# Patient Record
Sex: Male | Born: 1955 | Race: White | Hispanic: No | Marital: Married | Smoking: Current every day smoker
Health system: Southern US, Community
[De-identification: ages and names within clinical notes are randomized; demographics above are authoritative.]

## PROBLEM LIST (undated history)

## (undated) DIAGNOSIS — Z85820 Personal history of malignant melanoma of skin: Secondary | ICD-10-CM

## (undated) DIAGNOSIS — H409 Unspecified glaucoma: Secondary | ICD-10-CM

## (undated) DIAGNOSIS — I10 Essential (primary) hypertension: Secondary | ICD-10-CM

## (undated) DIAGNOSIS — Z794 Long term (current) use of insulin: Secondary | ICD-10-CM

## (undated) DIAGNOSIS — E119 Type 2 diabetes mellitus without complications: Secondary | ICD-10-CM

## (undated) DIAGNOSIS — Z8673 Personal history of transient ischemic attack (TIA), and cerebral infarction without residual deficits: Secondary | ICD-10-CM

## (undated) DIAGNOSIS — Z87891 Personal history of nicotine dependence: Secondary | ICD-10-CM

## (undated) DIAGNOSIS — E11319 Type 2 diabetes mellitus with unspecified diabetic retinopathy without macular edema: Secondary | ICD-10-CM

## (undated) HISTORY — DX: Type 2 diabetes mellitus with unspecified diabetic retinopathy without macular edema: E11.319

## (undated) HISTORY — DX: Type 2 diabetes mellitus without complications: E11.9

## (undated) HISTORY — DX: Essential (primary) hypertension: I10

## (undated) HISTORY — DX: Personal history of nicotine dependence: Z87.891

## (undated) HISTORY — DX: Personal history of transient ischemic attack (TIA), and cerebral infarction without residual deficits: Z86.73

## (undated) HISTORY — DX: Unspecified glaucoma: H40.9

## (undated) HISTORY — DX: Type 2 diabetes mellitus without complications: Z79.4

## (undated) HISTORY — DX: Personal history of malignant melanoma of skin: Z85.820

## (undated) HISTORY — PX: DEEP NECK LYMPH NODE BIOPSY / EXCISION: SUR126

---

## 2003-08-01 ENCOUNTER — Inpatient Hospital Stay (HOSPITAL_COMMUNITY): Admission: EM | Admit: 2003-08-01 | Discharge: 2003-08-06 | Payer: Self-pay

## 2006-01-10 IMAGING — CT CT ABDOMEN W/ CM
1 of 2 series · 15 of 32 positions shown, 19 images · IV contrast (MINIMAL G+ & 120 ML OMNI 300)
Comparison: none

CLINICAL DATA: Abdominal pain and rectal bleeding.  Currently taking Interferon for malignant melanoma.
CT ABDOMEN WITH CONTRAST
Helical transaxial images of the abdomen and pelvis were obtained with oral contrast and 100 cc of Omnipaque 300 intravenous contrast.  This demonstrates diffuse low density colon wall thickening with sparing of the rectum.  There is associated pericolonic soft tissue stranding adjacent to the right colon and hepatic flexure.  Diffuse fatty infiltration of the liver is noted with an area of sparing adjacent to the gallbladder.  Prominent interstitial markings at the lung bases with mild bilateral dependent atelectasis.  Normal appearing spleen, pancreas, gallbladder, kidneys, and adrenal glands.  No gastrointestinal abnormalities or enlarged lymph nodes.  No evidence of bony metastatic disease.  
IMPRESSION
1.  Pancolitis.  This is most likely infectious in origin.  Pseudomembranous colitis can have this appearance.  Inflammatory and ischemic colitis are less likely possibilities.
2.  Diffuse fatty infiltration of the liver with an area of sparing adjacent to the gallbladder.  
3.  Interstitial lung disease.  
CT PELVIS WITH CONTRAST
Changes of colitis are again noted with rectal sparing.  Normal appearing urinary bladder.  No masses or enlarged lymph nodes.  No evidence of bony metastatic disease.
Colitis with rectal sparing.  Please see the above discussion.

[Series 2: routine abdomen · axial · 0.70mm/px · z∈[-460,-20]mm · 15 of 95 slices shown, 19 images]
[im 4/95  soft-tissue]
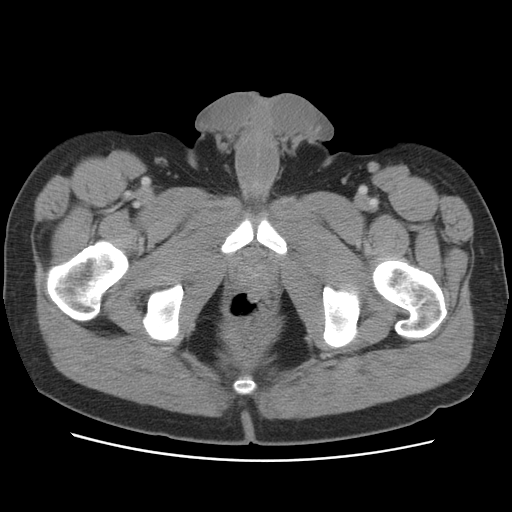
[im 4/95  bone]
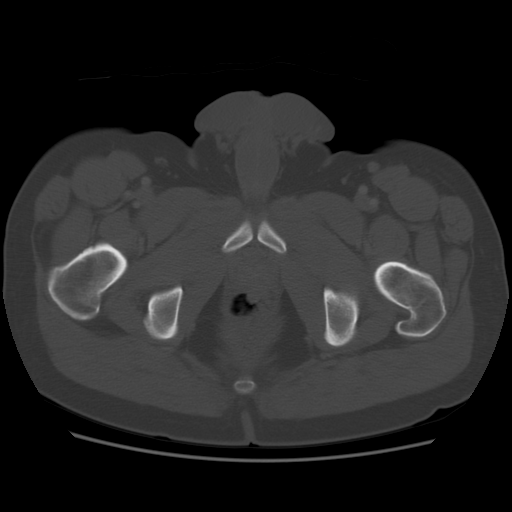
[im 12/95  soft-tissue]
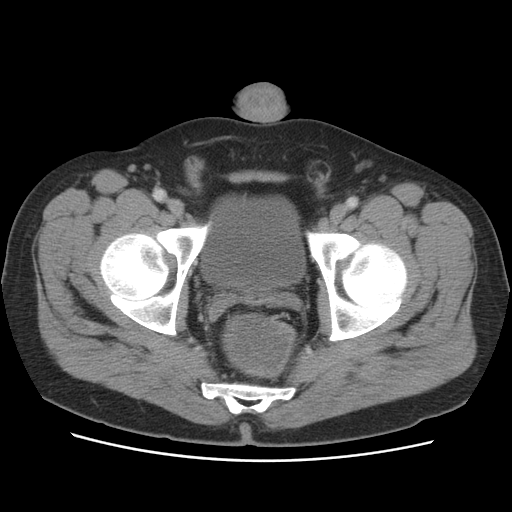
[im 20/95  soft-tissue]
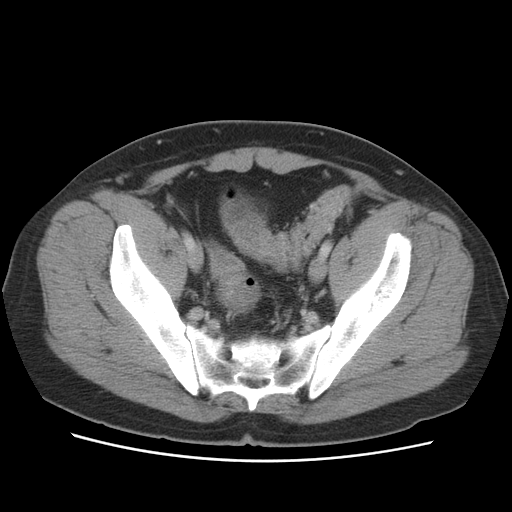
[im 28/95  soft-tissue]
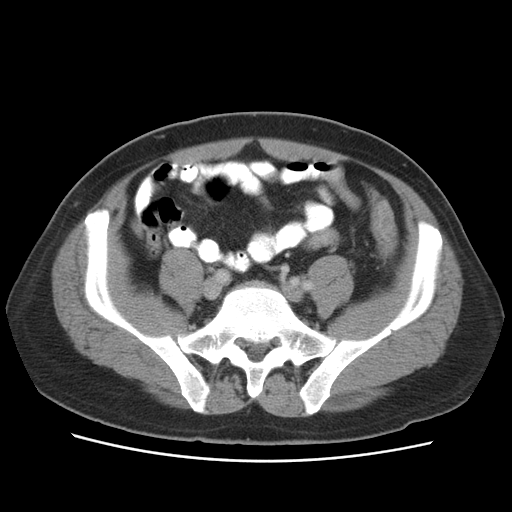
[im 32/95  soft-tissue]
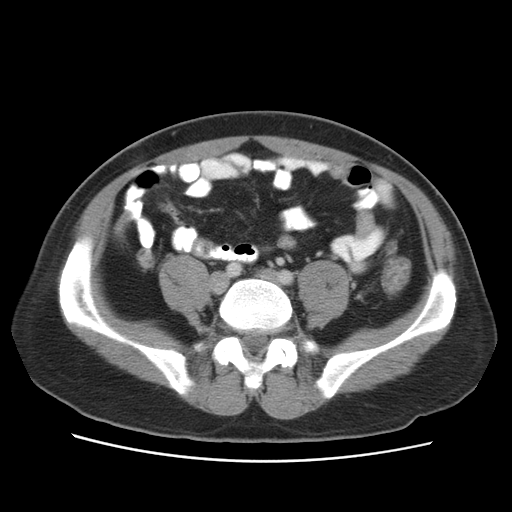
[im 40/95  soft-tissue]
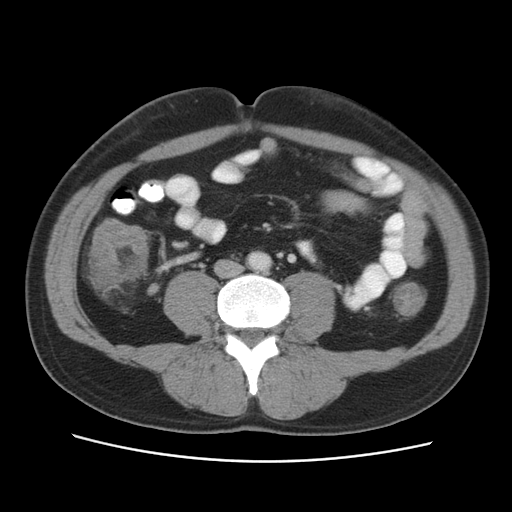
[im 48/95  soft-tissue]
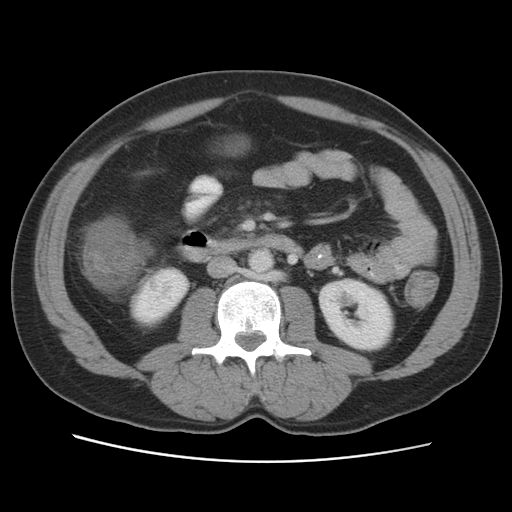
[im 55/95  soft-tissue]
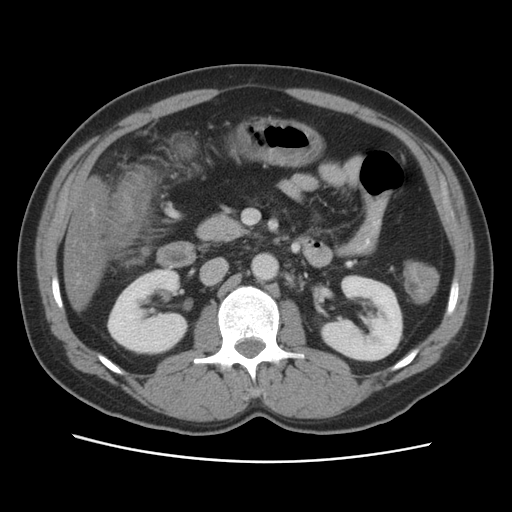
[im 63/95  soft-tissue]
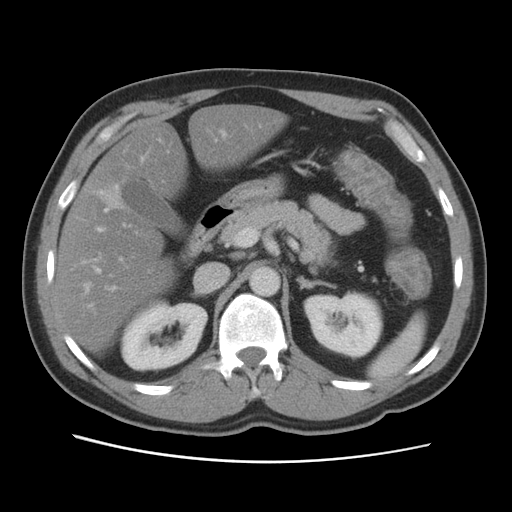
[im 63/95  bone]
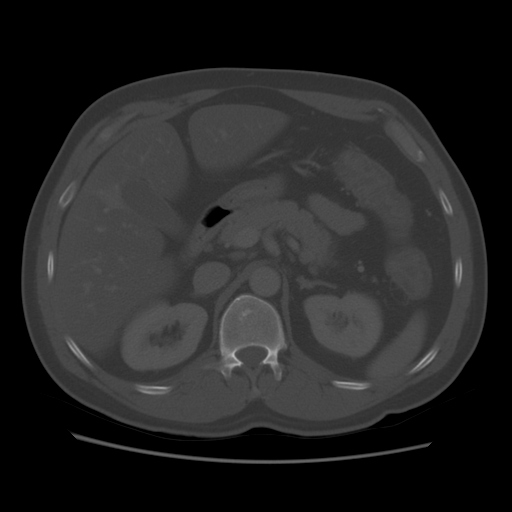
[im 67/95  soft-tissue]
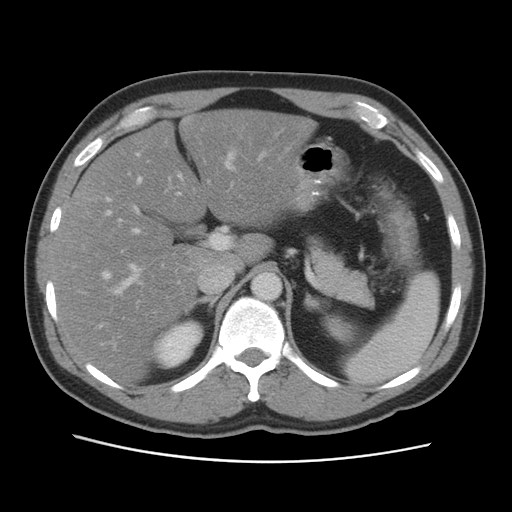
[im 75/95  soft-tissue]
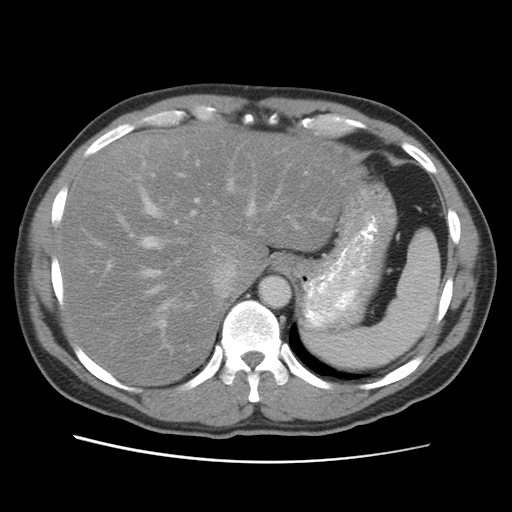
[im 79/95  lung]
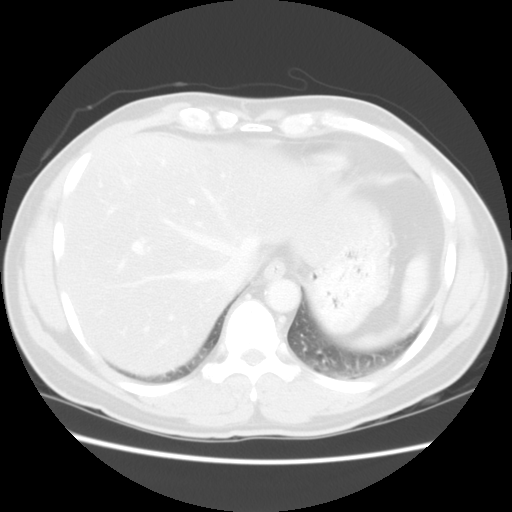
[im 83/95  soft-tissue]
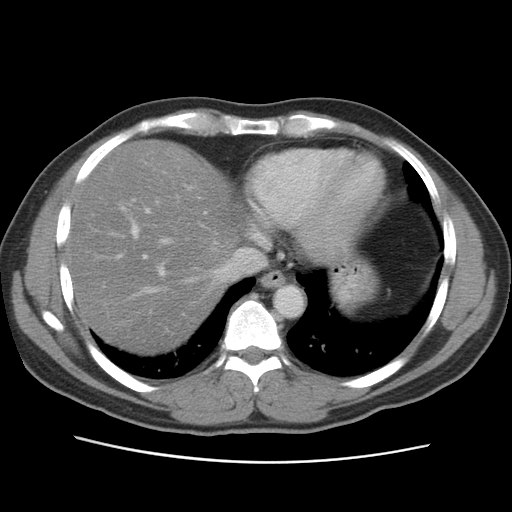
[im 83/95  lung]
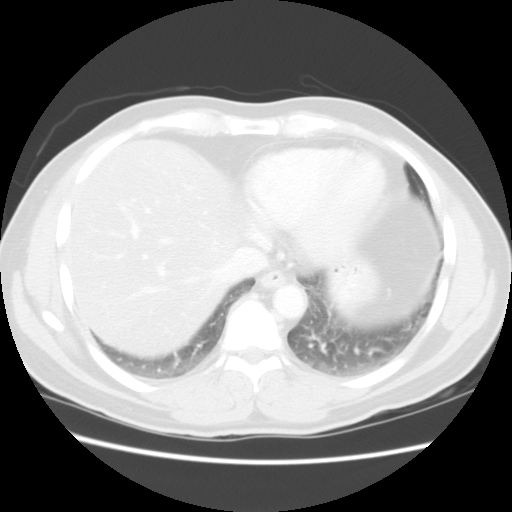
[im 87/95  lung]
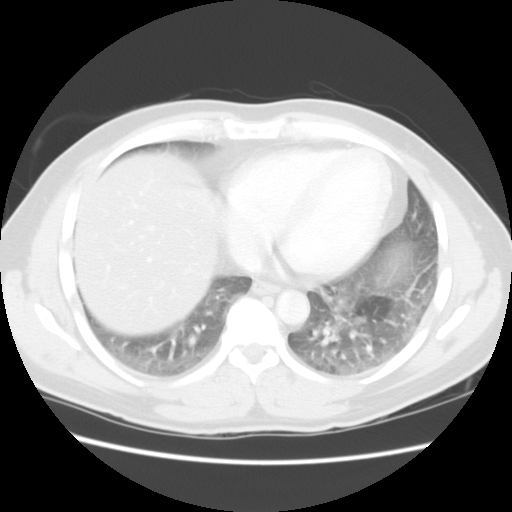
[im 91/95  soft-tissue]
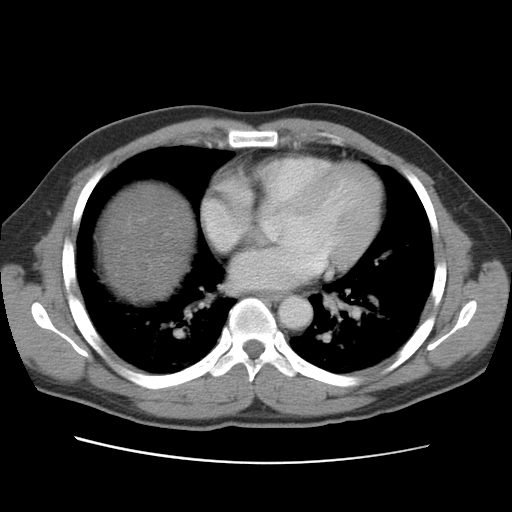
[im 91/95  lung]
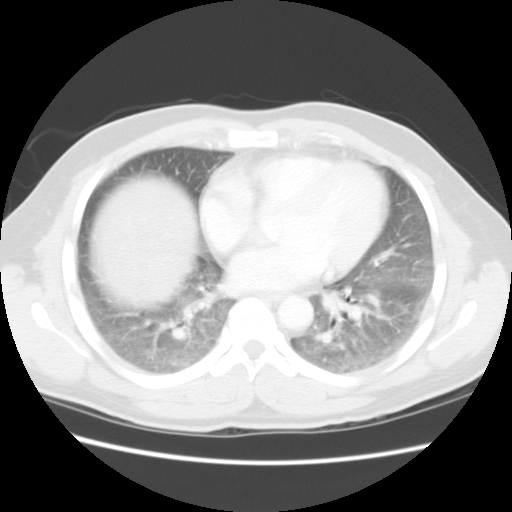

[15 of 32 positions shown; findings below may reference images not displayed]

## 2016-06-27 DIAGNOSIS — E119 Type 2 diabetes mellitus without complications: Secondary | ICD-10-CM

## 2016-06-27 DIAGNOSIS — I6350 Cerebral infarction due to unspecified occlusion or stenosis of unspecified cerebral artery: Secondary | ICD-10-CM

## 2019-05-15 DIAGNOSIS — H401134 Primary open-angle glaucoma, bilateral, indeterminate stage: Secondary | ICD-10-CM | POA: Diagnosis not present

## 2019-05-27 DIAGNOSIS — E782 Mixed hyperlipidemia: Secondary | ICD-10-CM | POA: Diagnosis not present

## 2019-05-27 DIAGNOSIS — Z794 Long term (current) use of insulin: Secondary | ICD-10-CM | POA: Diagnosis not present

## 2019-05-27 DIAGNOSIS — Z713 Dietary counseling and surveillance: Secondary | ICD-10-CM | POA: Diagnosis not present

## 2019-05-27 DIAGNOSIS — E119 Type 2 diabetes mellitus without complications: Secondary | ICD-10-CM | POA: Diagnosis not present

## 2019-05-27 DIAGNOSIS — I10 Essential (primary) hypertension: Secondary | ICD-10-CM | POA: Diagnosis not present

## 2019-06-27 ENCOUNTER — Encounter: Payer: Self-pay | Admitting: Pharmacist

## 2019-06-27 DIAGNOSIS — E119 Type 2 diabetes mellitus without complications: Secondary | ICD-10-CM | POA: Diagnosis not present

## 2019-06-27 DIAGNOSIS — Z713 Dietary counseling and surveillance: Secondary | ICD-10-CM | POA: Diagnosis not present

## 2019-06-27 DIAGNOSIS — Z794 Long term (current) use of insulin: Secondary | ICD-10-CM | POA: Diagnosis not present

## 2019-06-27 DIAGNOSIS — Z6824 Body mass index (BMI) 24.0-24.9, adult: Secondary | ICD-10-CM | POA: Diagnosis not present

## 2019-08-20 DIAGNOSIS — E782 Mixed hyperlipidemia: Secondary | ICD-10-CM | POA: Diagnosis not present

## 2019-08-20 DIAGNOSIS — Z6824 Body mass index (BMI) 24.0-24.9, adult: Secondary | ICD-10-CM | POA: Diagnosis not present

## 2019-08-20 DIAGNOSIS — I10 Essential (primary) hypertension: Secondary | ICD-10-CM | POA: Diagnosis not present

## 2019-08-20 DIAGNOSIS — E119 Type 2 diabetes mellitus without complications: Secondary | ICD-10-CM | POA: Diagnosis not present

## 2019-08-20 DIAGNOSIS — Z794 Long term (current) use of insulin: Secondary | ICD-10-CM | POA: Diagnosis not present

## 2019-11-20 DIAGNOSIS — E782 Mixed hyperlipidemia: Secondary | ICD-10-CM | POA: Diagnosis not present

## 2019-11-20 DIAGNOSIS — Z794 Long term (current) use of insulin: Secondary | ICD-10-CM | POA: Diagnosis not present

## 2019-11-20 DIAGNOSIS — Z23 Encounter for immunization: Secondary | ICD-10-CM | POA: Diagnosis not present

## 2019-11-20 DIAGNOSIS — I10 Essential (primary) hypertension: Secondary | ICD-10-CM | POA: Diagnosis not present

## 2019-11-20 DIAGNOSIS — E119 Type 2 diabetes mellitus without complications: Secondary | ICD-10-CM | POA: Diagnosis not present

## 2019-11-22 DIAGNOSIS — H401134 Primary open-angle glaucoma, bilateral, indeterminate stage: Secondary | ICD-10-CM | POA: Diagnosis not present

## 2020-01-15 DIAGNOSIS — Z9181 History of falling: Secondary | ICD-10-CM | POA: Diagnosis not present

## 2020-01-15 DIAGNOSIS — E785 Hyperlipidemia, unspecified: Secondary | ICD-10-CM | POA: Diagnosis not present

## 2020-01-15 DIAGNOSIS — Z0000 Encounter for general adult medical examination without abnormal findings: Secondary | ICD-10-CM | POA: Diagnosis not present

## 2020-01-31 DIAGNOSIS — H401132 Primary open-angle glaucoma, bilateral, moderate stage: Secondary | ICD-10-CM | POA: Diagnosis not present

## 2020-02-04 DIAGNOSIS — H401131 Primary open-angle glaucoma, bilateral, mild stage: Secondary | ICD-10-CM | POA: Diagnosis not present

## 2020-02-04 DIAGNOSIS — E119 Type 2 diabetes mellitus without complications: Secondary | ICD-10-CM | POA: Diagnosis not present

## 2020-02-04 DIAGNOSIS — Z794 Long term (current) use of insulin: Secondary | ICD-10-CM | POA: Diagnosis not present

## 2020-02-04 DIAGNOSIS — Z01818 Encounter for other preprocedural examination: Secondary | ICD-10-CM | POA: Diagnosis not present

## 2020-02-17 DIAGNOSIS — H25811 Combined forms of age-related cataract, right eye: Secondary | ICD-10-CM | POA: Diagnosis not present

## 2020-02-17 DIAGNOSIS — H2511 Age-related nuclear cataract, right eye: Secondary | ICD-10-CM | POA: Diagnosis not present

## 2020-02-17 DIAGNOSIS — H401111 Primary open-angle glaucoma, right eye, mild stage: Secondary | ICD-10-CM | POA: Diagnosis not present

## 2020-02-19 DIAGNOSIS — I10 Essential (primary) hypertension: Secondary | ICD-10-CM | POA: Diagnosis not present

## 2020-02-19 DIAGNOSIS — E782 Mixed hyperlipidemia: Secondary | ICD-10-CM | POA: Diagnosis not present

## 2020-02-19 DIAGNOSIS — E119 Type 2 diabetes mellitus without complications: Secondary | ICD-10-CM | POA: Diagnosis not present

## 2020-02-19 DIAGNOSIS — Z794 Long term (current) use of insulin: Secondary | ICD-10-CM | POA: Diagnosis not present

## 2020-02-19 DIAGNOSIS — Z8673 Personal history of transient ischemic attack (TIA), and cerebral infarction without residual deficits: Secondary | ICD-10-CM | POA: Diagnosis not present

## 2020-03-26 DIAGNOSIS — H401122 Primary open-angle glaucoma, left eye, moderate stage: Secondary | ICD-10-CM | POA: Diagnosis not present

## 2020-03-26 DIAGNOSIS — H2512 Age-related nuclear cataract, left eye: Secondary | ICD-10-CM | POA: Diagnosis not present

## 2020-03-26 DIAGNOSIS — H25812 Combined forms of age-related cataract, left eye: Secondary | ICD-10-CM | POA: Diagnosis not present

## 2020-05-20 DIAGNOSIS — I10 Essential (primary) hypertension: Secondary | ICD-10-CM | POA: Diagnosis not present

## 2020-05-20 DIAGNOSIS — Z794 Long term (current) use of insulin: Secondary | ICD-10-CM | POA: Diagnosis not present

## 2020-05-20 DIAGNOSIS — Z8673 Personal history of transient ischemic attack (TIA), and cerebral infarction without residual deficits: Secondary | ICD-10-CM | POA: Diagnosis not present

## 2020-05-20 DIAGNOSIS — E119 Type 2 diabetes mellitus without complications: Secondary | ICD-10-CM | POA: Diagnosis not present

## 2020-05-20 DIAGNOSIS — E782 Mixed hyperlipidemia: Secondary | ICD-10-CM | POA: Diagnosis not present

## 2020-09-11 DIAGNOSIS — E119 Type 2 diabetes mellitus without complications: Secondary | ICD-10-CM | POA: Diagnosis not present

## 2020-09-11 DIAGNOSIS — Z20822 Contact with and (suspected) exposure to covid-19: Secondary | ICD-10-CM | POA: Diagnosis not present

## 2020-09-11 DIAGNOSIS — E785 Hyperlipidemia, unspecified: Secondary | ICD-10-CM | POA: Diagnosis not present

## 2020-09-11 DIAGNOSIS — I10 Essential (primary) hypertension: Secondary | ICD-10-CM | POA: Diagnosis not present

## 2020-09-11 DIAGNOSIS — Z794 Long term (current) use of insulin: Secondary | ICD-10-CM | POA: Diagnosis not present

## 2020-09-11 DIAGNOSIS — Z8673 Personal history of transient ischemic attack (TIA), and cerebral infarction without residual deficits: Secondary | ICD-10-CM | POA: Diagnosis not present

## 2020-09-11 DIAGNOSIS — E1169 Type 2 diabetes mellitus with other specified complication: Secondary | ICD-10-CM | POA: Diagnosis not present

## 2020-09-11 DIAGNOSIS — Z79899 Other long term (current) drug therapy: Secondary | ICD-10-CM | POA: Diagnosis not present

## 2020-12-16 DIAGNOSIS — E785 Hyperlipidemia, unspecified: Secondary | ICD-10-CM | POA: Diagnosis not present

## 2020-12-16 DIAGNOSIS — E1169 Type 2 diabetes mellitus with other specified complication: Secondary | ICD-10-CM | POA: Diagnosis not present

## 2020-12-16 DIAGNOSIS — Z720 Tobacco use: Secondary | ICD-10-CM | POA: Diagnosis not present

## 2020-12-16 DIAGNOSIS — Z794 Long term (current) use of insulin: Secondary | ICD-10-CM | POA: Diagnosis not present

## 2020-12-16 DIAGNOSIS — Z79899 Other long term (current) drug therapy: Secondary | ICD-10-CM | POA: Diagnosis not present

## 2020-12-16 DIAGNOSIS — Z23 Encounter for immunization: Secondary | ICD-10-CM | POA: Diagnosis not present

## 2020-12-16 DIAGNOSIS — Z87891 Personal history of nicotine dependence: Secondary | ICD-10-CM | POA: Diagnosis not present

## 2020-12-16 DIAGNOSIS — E119 Type 2 diabetes mellitus without complications: Secondary | ICD-10-CM | POA: Diagnosis not present

## 2020-12-16 DIAGNOSIS — I10 Essential (primary) hypertension: Secondary | ICD-10-CM | POA: Diagnosis not present

## 2020-12-25 DIAGNOSIS — E119 Type 2 diabetes mellitus without complications: Secondary | ICD-10-CM | POA: Diagnosis not present

## 2020-12-25 DIAGNOSIS — Z794 Long term (current) use of insulin: Secondary | ICD-10-CM | POA: Diagnosis not present

## 2021-01-18 DIAGNOSIS — E875 Hyperkalemia: Secondary | ICD-10-CM | POA: Diagnosis not present

## 2021-01-19 DIAGNOSIS — Z0000 Encounter for general adult medical examination without abnormal findings: Secondary | ICD-10-CM | POA: Diagnosis not present

## 2021-01-25 DIAGNOSIS — E119 Type 2 diabetes mellitus without complications: Secondary | ICD-10-CM | POA: Diagnosis not present

## 2021-01-25 DIAGNOSIS — Z794 Long term (current) use of insulin: Secondary | ICD-10-CM | POA: Diagnosis not present

## 2021-02-25 DIAGNOSIS — Z794 Long term (current) use of insulin: Secondary | ICD-10-CM | POA: Diagnosis not present

## 2021-02-25 DIAGNOSIS — E119 Type 2 diabetes mellitus without complications: Secondary | ICD-10-CM | POA: Diagnosis not present

## 2021-03-17 DIAGNOSIS — I10 Essential (primary) hypertension: Secondary | ICD-10-CM | POA: Diagnosis not present

## 2021-03-17 DIAGNOSIS — E119 Type 2 diabetes mellitus without complications: Secondary | ICD-10-CM | POA: Diagnosis not present

## 2021-03-17 DIAGNOSIS — E1169 Type 2 diabetes mellitus with other specified complication: Secondary | ICD-10-CM | POA: Diagnosis not present

## 2021-03-17 DIAGNOSIS — E785 Hyperlipidemia, unspecified: Secondary | ICD-10-CM | POA: Diagnosis not present

## 2021-03-17 DIAGNOSIS — Z794 Long term (current) use of insulin: Secondary | ICD-10-CM | POA: Diagnosis not present

## 2021-03-17 DIAGNOSIS — Z85828 Personal history of other malignant neoplasm of skin: Secondary | ICD-10-CM | POA: Diagnosis not present

## 2021-04-01 DIAGNOSIS — L814 Other melanin hyperpigmentation: Secondary | ICD-10-CM | POA: Diagnosis not present

## 2021-04-01 DIAGNOSIS — D225 Melanocytic nevi of trunk: Secondary | ICD-10-CM | POA: Diagnosis not present

## 2021-04-01 DIAGNOSIS — D2239 Melanocytic nevi of other parts of face: Secondary | ICD-10-CM | POA: Diagnosis not present

## 2021-04-01 DIAGNOSIS — L821 Other seborrheic keratosis: Secondary | ICD-10-CM | POA: Diagnosis not present

## 2021-04-01 DIAGNOSIS — L820 Inflamed seborrheic keratosis: Secondary | ICD-10-CM | POA: Diagnosis not present

## 2021-04-16 DIAGNOSIS — E119 Type 2 diabetes mellitus without complications: Secondary | ICD-10-CM | POA: Diagnosis not present

## 2021-05-16 DIAGNOSIS — E119 Type 2 diabetes mellitus without complications: Secondary | ICD-10-CM | POA: Diagnosis not present

## 2021-05-21 DIAGNOSIS — H401134 Primary open-angle glaucoma, bilateral, indeterminate stage: Secondary | ICD-10-CM | POA: Diagnosis not present

## 2021-06-16 DIAGNOSIS — E119 Type 2 diabetes mellitus without complications: Secondary | ICD-10-CM | POA: Diagnosis not present

## 2021-06-22 DIAGNOSIS — E1169 Type 2 diabetes mellitus with other specified complication: Secondary | ICD-10-CM | POA: Diagnosis not present

## 2021-06-22 DIAGNOSIS — E119 Type 2 diabetes mellitus without complications: Secondary | ICD-10-CM | POA: Diagnosis not present

## 2021-06-22 DIAGNOSIS — Z6824 Body mass index (BMI) 24.0-24.9, adult: Secondary | ICD-10-CM | POA: Diagnosis not present

## 2021-06-22 DIAGNOSIS — E785 Hyperlipidemia, unspecified: Secondary | ICD-10-CM | POA: Diagnosis not present

## 2021-06-22 DIAGNOSIS — I10 Essential (primary) hypertension: Secondary | ICD-10-CM | POA: Diagnosis not present

## 2021-06-22 DIAGNOSIS — Z794 Long term (current) use of insulin: Secondary | ICD-10-CM | POA: Diagnosis not present

## 2021-07-16 DIAGNOSIS — E119 Type 2 diabetes mellitus without complications: Secondary | ICD-10-CM | POA: Diagnosis not present

## 2021-08-16 DIAGNOSIS — E119 Type 2 diabetes mellitus without complications: Secondary | ICD-10-CM | POA: Diagnosis not present

## 2021-09-10 DIAGNOSIS — R051 Acute cough: Secondary | ICD-10-CM | POA: Diagnosis not present

## 2021-09-10 DIAGNOSIS — R0981 Nasal congestion: Secondary | ICD-10-CM | POA: Diagnosis not present

## 2021-09-16 DIAGNOSIS — E119 Type 2 diabetes mellitus without complications: Secondary | ICD-10-CM | POA: Diagnosis not present

## 2021-10-01 DIAGNOSIS — Z6824 Body mass index (BMI) 24.0-24.9, adult: Secondary | ICD-10-CM | POA: Diagnosis not present

## 2021-10-01 DIAGNOSIS — J069 Acute upper respiratory infection, unspecified: Secondary | ICD-10-CM | POA: Diagnosis not present

## 2021-10-01 DIAGNOSIS — E785 Hyperlipidemia, unspecified: Secondary | ICD-10-CM | POA: Diagnosis not present

## 2021-10-01 DIAGNOSIS — Z794 Long term (current) use of insulin: Secondary | ICD-10-CM | POA: Diagnosis not present

## 2021-10-01 DIAGNOSIS — E1169 Type 2 diabetes mellitus with other specified complication: Secondary | ICD-10-CM | POA: Diagnosis not present

## 2021-10-01 DIAGNOSIS — I10 Essential (primary) hypertension: Secondary | ICD-10-CM | POA: Diagnosis not present

## 2021-10-01 DIAGNOSIS — E119 Type 2 diabetes mellitus without complications: Secondary | ICD-10-CM | POA: Diagnosis not present

## 2021-10-04 DIAGNOSIS — L570 Actinic keratosis: Secondary | ICD-10-CM | POA: Diagnosis not present

## 2021-10-04 DIAGNOSIS — L821 Other seborrheic keratosis: Secondary | ICD-10-CM | POA: Diagnosis not present

## 2021-10-04 DIAGNOSIS — D485 Neoplasm of uncertain behavior of skin: Secondary | ICD-10-CM | POA: Diagnosis not present

## 2021-10-04 DIAGNOSIS — L814 Other melanin hyperpigmentation: Secondary | ICD-10-CM | POA: Diagnosis not present

## 2021-10-04 DIAGNOSIS — D225 Melanocytic nevi of trunk: Secondary | ICD-10-CM | POA: Diagnosis not present

## 2021-10-04 DIAGNOSIS — D2239 Melanocytic nevi of other parts of face: Secondary | ICD-10-CM | POA: Diagnosis not present

## 2021-10-16 DIAGNOSIS — E119 Type 2 diabetes mellitus without complications: Secondary | ICD-10-CM | POA: Diagnosis not present

## 2021-11-16 DIAGNOSIS — E119 Type 2 diabetes mellitus without complications: Secondary | ICD-10-CM | POA: Diagnosis not present

## 2021-12-06 DIAGNOSIS — H401134 Primary open-angle glaucoma, bilateral, indeterminate stage: Secondary | ICD-10-CM | POA: Diagnosis not present

## 2021-12-16 DIAGNOSIS — E119 Type 2 diabetes mellitus without complications: Secondary | ICD-10-CM | POA: Diagnosis not present

## 2022-01-16 DIAGNOSIS — E119 Type 2 diabetes mellitus without complications: Secondary | ICD-10-CM | POA: Diagnosis not present

## 2022-02-02 DIAGNOSIS — Z794 Long term (current) use of insulin: Secondary | ICD-10-CM | POA: Diagnosis not present

## 2022-02-02 DIAGNOSIS — I10 Essential (primary) hypertension: Secondary | ICD-10-CM | POA: Diagnosis not present

## 2022-02-02 DIAGNOSIS — E785 Hyperlipidemia, unspecified: Secondary | ICD-10-CM | POA: Diagnosis not present

## 2022-02-02 DIAGNOSIS — E1169 Type 2 diabetes mellitus with other specified complication: Secondary | ICD-10-CM | POA: Diagnosis not present

## 2022-02-02 DIAGNOSIS — E119 Type 2 diabetes mellitus without complications: Secondary | ICD-10-CM | POA: Diagnosis not present

## 2022-02-16 DIAGNOSIS — E119 Type 2 diabetes mellitus without complications: Secondary | ICD-10-CM | POA: Diagnosis not present

## 2022-03-17 DIAGNOSIS — E119 Type 2 diabetes mellitus without complications: Secondary | ICD-10-CM | POA: Diagnosis not present

## 2022-03-18 DIAGNOSIS — E119 Type 2 diabetes mellitus without complications: Secondary | ICD-10-CM | POA: Diagnosis not present

## 2022-04-04 DIAGNOSIS — E119 Type 2 diabetes mellitus without complications: Secondary | ICD-10-CM | POA: Diagnosis not present

## 2022-04-04 DIAGNOSIS — E1169 Type 2 diabetes mellitus with other specified complication: Secondary | ICD-10-CM | POA: Diagnosis not present

## 2022-04-04 DIAGNOSIS — I10 Essential (primary) hypertension: Secondary | ICD-10-CM | POA: Diagnosis not present

## 2022-04-06 DIAGNOSIS — D2239 Melanocytic nevi of other parts of face: Secondary | ICD-10-CM | POA: Diagnosis not present

## 2022-04-06 DIAGNOSIS — Z85820 Personal history of malignant melanoma of skin: Secondary | ICD-10-CM | POA: Diagnosis not present

## 2022-04-06 DIAGNOSIS — D485 Neoplasm of uncertain behavior of skin: Secondary | ICD-10-CM | POA: Diagnosis not present

## 2022-04-06 DIAGNOSIS — D225 Melanocytic nevi of trunk: Secondary | ICD-10-CM | POA: Diagnosis not present

## 2022-04-06 DIAGNOSIS — L821 Other seborrheic keratosis: Secondary | ICD-10-CM | POA: Diagnosis not present

## 2022-04-06 DIAGNOSIS — L570 Actinic keratosis: Secondary | ICD-10-CM | POA: Diagnosis not present

## 2022-04-17 DIAGNOSIS — E119 Type 2 diabetes mellitus without complications: Secondary | ICD-10-CM | POA: Diagnosis not present

## 2022-04-27 ENCOUNTER — Encounter: Payer: Self-pay | Admitting: Oncology

## 2022-04-27 ENCOUNTER — Inpatient Hospital Stay: Payer: Medicare Other | Attending: Oncology | Admitting: Oncology

## 2022-04-27 ENCOUNTER — Inpatient Hospital Stay: Payer: Medicare Other

## 2022-04-27 VITALS — BP 138/76 | HR 77 | Temp 97.6°F | Resp 14 | Ht 68.5 in | Wt 170.9 lb

## 2022-04-27 DIAGNOSIS — L578 Other skin changes due to chronic exposure to nonionizing radiation: Secondary | ICD-10-CM

## 2022-04-27 DIAGNOSIS — F17210 Nicotine dependence, cigarettes, uncomplicated: Secondary | ICD-10-CM | POA: Diagnosis not present

## 2022-04-27 DIAGNOSIS — C434 Malignant melanoma of scalp and neck: Secondary | ICD-10-CM | POA: Insufficient documentation

## 2022-04-27 DIAGNOSIS — E1142 Type 2 diabetes mellitus with diabetic polyneuropathy: Secondary | ICD-10-CM

## 2022-04-27 DIAGNOSIS — Z85820 Personal history of malignant melanoma of skin: Secondary | ICD-10-CM | POA: Diagnosis not present

## 2022-04-27 DIAGNOSIS — G629 Polyneuropathy, unspecified: Secondary | ICD-10-CM | POA: Insufficient documentation

## 2022-04-27 DIAGNOSIS — E11319 Type 2 diabetes mellitus with unspecified diabetic retinopathy without macular edema: Secondary | ICD-10-CM | POA: Diagnosis not present

## 2022-04-27 DIAGNOSIS — Z800 Family history of malignant neoplasm of digestive organs: Secondary | ICD-10-CM | POA: Insufficient documentation

## 2022-04-27 DIAGNOSIS — E119 Type 2 diabetes mellitus without complications: Secondary | ICD-10-CM | POA: Insufficient documentation

## 2022-04-27 DIAGNOSIS — Z809 Family history of malignant neoplasm, unspecified: Secondary | ICD-10-CM | POA: Insufficient documentation

## 2022-04-27 DIAGNOSIS — Z803 Family history of malignant neoplasm of breast: Secondary | ICD-10-CM | POA: Diagnosis not present

## 2022-04-27 DIAGNOSIS — Z720 Tobacco use: Secondary | ICD-10-CM

## 2022-04-27 DIAGNOSIS — I10 Essential (primary) hypertension: Secondary | ICD-10-CM | POA: Insufficient documentation

## 2022-04-27 DIAGNOSIS — C4360 Malignant melanoma of unspecified upper limb, including shoulder: Secondary | ICD-10-CM

## 2022-04-27 LAB — CBC WITH DIFFERENTIAL/PLATELET
Abs Immature Granulocytes: 0.01 10*3/uL (ref 0.00–0.07)
Basophils Absolute: 0.1 10*3/uL (ref 0.0–0.1)
Basophils Relative: 1 %
Eosinophils Absolute: 0.3 10*3/uL (ref 0.0–0.5)
Eosinophils Relative: 4 %
HCT: 32.7 % — ABNORMAL LOW (ref 39.0–52.0)
Hemoglobin: 9.2 g/dL — ABNORMAL LOW (ref 13.0–17.0)
Immature Granulocytes: 0 %
Lymphocytes Relative: 23 %
Lymphs Abs: 1.8 10*3/uL (ref 0.7–4.0)
MCH: 21.3 pg — ABNORMAL LOW (ref 26.0–34.0)
MCHC: 28.1 g/dL — ABNORMAL LOW (ref 30.0–36.0)
MCV: 75.7 fL — ABNORMAL LOW (ref 80.0–100.0)
Monocytes Absolute: 0.6 10*3/uL (ref 0.1–1.0)
Monocytes Relative: 8 %
Neutro Abs: 4.9 10*3/uL (ref 1.7–7.7)
Neutrophils Relative %: 64 %
Platelets: 320 10*3/uL (ref 150–400)
RBC: 4.32 MIL/uL (ref 4.22–5.81)
RDW: 16.7 % — ABNORMAL HIGH (ref 11.5–15.5)
WBC: 7.6 10*3/uL (ref 4.0–10.5)
nRBC: 0 % (ref 0.0–0.2)

## 2022-04-27 LAB — COMPREHENSIVE METABOLIC PANEL
ALT: 25 U/L (ref 0–44)
AST: 24 U/L (ref 15–41)
Albumin: 4.1 g/dL (ref 3.5–5.0)
Alkaline Phosphatase: 95 U/L (ref 38–126)
Anion gap: 9 (ref 5–15)
BUN: 19 mg/dL (ref 8–23)
CO2: 24 mmol/L (ref 22–32)
Calcium: 9.2 mg/dL (ref 8.9–10.3)
Chloride: 107 mmol/L (ref 98–111)
Creatinine, Ser: 0.94 mg/dL (ref 0.61–1.24)
GFR, Estimated: >60 mL/min (ref >60)
Glucose, Bld: 173 mg/dL — ABNORMAL HIGH (ref 70–99)
Potassium: 4.2 mmol/L (ref 3.5–5.1)
Sodium: 140 mmol/L (ref 135–145)
Total Bilirubin: 0.6 mg/dL (ref 0.3–1.2)
Total Protein: 7.3 g/dL (ref 6.5–8.1)

## 2022-04-27 LAB — LACTATE DEHYDROGENASE: LDH: 141 U/L (ref 98–192)

## 2022-04-27 NOTE — Progress Notes (Signed)
[facility] Initial Visit:  Patient Care Team: Paulina Fusi, MD as PCP - General (Internal Medicine)  CHIEF COMPLAINTS/PURPOSE OF CONSULTATION:  Oncology History  Malignant melanoma (HCC)  05/03/2022 Initial Diagnosis   Malignant melanoma (HCC)     HISTORY OF PRESENTING ILLNESS: Bobby Downs 67 y.o. male is here because of  history of malignant melanoma Medical history notable for diabetes mellitus type 2, diabetic retinopathy, glaucoma, CVA, melanoma, hypertension,, dysplastic nevi and seborrheic keratosis  July 10, 2002: Noted 1 year of swelling in left neck.  MRI of the neck was performed that showed an enlarged lymph node.  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  Subsequently underwent left neck dissection.  Clinically involved LN was consistent with melanoma September 19 2002:  PET showed some hypermetabolic activity in his post-operative site but was otherwise negative.  September 30 2002:  [facility] Consult.  High dose Interferon alpha given at 20 MIU per m2 daily Monday through Friday for 4 weeks and then subcutaneously at 10 MIU per m2 3 times a week for 11 months was recommended October 09 2002:  Pathology review at [facility].  He had 1 lymph node with a 3 cm deposit of disease and 7 negative lymph nodes resected.  October 25 2002:  MRI brain and CT CAP negative for metastasis.   October 28 2002:  Began adjuvant interferon November 03 2003:  Completed Adjuvant IFN July 11 2006:  PET/CT a new tiny left leg cutaneous lesion with mild FDG uptake is thought to be benign; stable subcentimeter non-FDG avid lung nodules in the right middle and left lower lobes. January 17 2007:  PET/CT no evidence for metastatic disease; there is a slight enlargement of a left axillary lymph node that was not hypermetabolic and stable indeterminate subcentimeter pulmonary nodules in the right middle lobe and left lobe. January 16 2008:  PET CT no evidence of  recurrence; there are several stable FDG negative pulmonary nodules. January 29 2009:  FNA of left cervical LN's negative for malignancy May 05 2009- Right neck dissection .  Pathology negative for malignancy December 23 2010:  PET/CT negative for recurrence December 16 2011:  PET CT Small inferior right axillary lymph node which demonstrates mild  FDG-uptake which is nonspecific; benign etiology favored. Tiny indeterminate right upper lobe pulmonary nodule.   April 27 2022:  [name] Consult Patient states that in 2004 he noted emergence of a left cervical lymph node which was initially evaluated in [place], [place].  He was subsequently referred to [facility] and underwent cervical lymphadenectomy which lead to the diagnosis of malignant melanoma.  He subsequently went to [place] for a second opinion and received 1 year of adjuvant interferon.  The primary site was never identified.   Following the surgeries he was on surveillance with imaging but had to stop going because he lost his job and insurance  He is also followed by dermatology Hobbies are playing golf.  History of sunburns while in the [employer] and spent a lot of time outside as a child.    Social:  Married.  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  Worked at [employer] then [employer].  Smokes 1 ppd of cigarettes.  EtOH none  FM[facility] Mother died 42 DM Type II, colon cancer Estranged from biological father Sister alive 72 breast cancer Sister alive 67 well but has history of hearing problems.  Review of Systems  Constitutional:  Negative for appetite change, chills, fatigue, fever and unexpected weight change.       Occasional night sweats  HENT:   Negative for mouth sores, nosebleeds, sore throat and voice change.        Occasionally has trouble swallowing   Eyes:  Negative for eye problems and icterus.       Vision changes:  None  Respiratory:  Negative for chest tightness,  cough, hemoptysis, shortness of breath and wheezing.        PND:  none Orthopnea:  none DOE:    Cardiovascular:  Negative for chest pain and leg swelling.       Intermittent palpitations  Gastrointestinal:  Negative for abdominal pain, blood in stool, constipation, diarrhea, nausea and vomiting.  Endocrine: Negative for hot flashes.       Cold intolerance:  none Heat intolerance:  none  Genitourinary:  Negative for bladder incontinence, difficulty urinating, dysuria, frequency, hematuria and nocturia.   Musculoskeletal:  Negative for arthralgias, gait problem, myalgias, neck pain and neck stiffness.       Chronic mild lumbar pain  Skin:  Negative for itching, rash and wound.  Neurological:  Negative for dizziness, gait problem, headaches, light-headedness, seizures and speech difficulty.       Has some weakness in left leg which began within the past year.  Has neuropathy of hands and feet  Hematological:  Negative for adenopathy. Does not bruise/bleed easily.  Psychiatric/Behavioral:  Negative for sleep disturbance and suicidal ideas. The patient is not nervous/anxious.     MEDICAL HISTORY: Past Medical History:  Diagnosis Date   Diabetes mellitus without complication (HCC)    Diabetic retinopathy (HCC)    Glaucoma    History of CVA (cerebrovascular accident)    History of melanoma    History of tobacco use    Hypertension    Insulin dependent type 2 diabetes mellitus (HCC)     SURGICAL HISTORY: Past Surgical History:  Procedure Laterality Date   DEEP NECK LYMPH NODE BIOPSY / EXCISION      SOCIAL HISTORY: Social History   Socioeconomic History   Marital status: Married    Spouse name: Not on file   Number of children: Not on file   Years of education: Not on file   Highest education level: Not on file  Occupational History   Not on file  Tobacco Use   Smoking status: Every Day    Packs/day: 1.00    Years: 40.00    Additional pack years: 0.00    Total pack  years: 40.00    Types: Cigarettes   Smokeless tobacco: Never  Vaping Use   Vaping Use: Never used  Substance and Sexual Activity   Alcohol use: Not Currently   Drug use: Never   Sexual activity: Not on file  Other Topics Concern   Not on file  Social History Narrative   Not on file   Social Determinants of Health   Financial Resource Strain: Not on file  Food Insecurity: No Food Insecurity (04/27/2022)   Hunger Vital Sign    Worried About Running Out of Food in the Last Year: Never true    Ran Out of Food in the Last Year: Never true  Transportation Needs: No Transportation Needs (04/27/2022)   PRAPARE - Administrator, Civil Service (Medical): No    Lack of Transportation (Non-Medical): No  Physical Activity: Not on file  Stress: Not on file  Social  Connections: Not on file  Intimate Partner Violence: Not At Risk (04/27/2022)   Humiliation, Afraid, Rape, and Kick questionnaire    Fear of Current or Ex-Partner: No    Emotionally Abused: No    Physically Abused: No    Sexually Abused: No    FAMILY HISTORY Family History  Problem Relation Age of Onset   Hypertension Mother    Hyperlipidemia Mother    Colon cancer Mother    Seizures Brother    Brain cancer Brother     ALLERGIES:  has No Known Allergies.  MEDICATIONS:  Current Outpatient Medications  Medication Sig Dispense Refill   aspirin EC 81 MG tablet Take 81 mg by mouth daily. Swallow whole.     clopidogrel (PLAVIX) 75 MG tablet Take 75 mg by mouth daily.     FARXIGA 10 MG TABS tablet Take 10 mg by mouth daily.     latanoprost (XALATAN) 0.005 % ophthalmic solution 1 drop at bedtime.     lisinopril (ZESTRIL) 10 MG tablet Take 1 tablet by mouth daily.     Multiple Vitamin (MULTIVITAMIN) capsule Take 1 capsule by mouth daily.     pravastatin (PRAVACHOL) 80 MG tablet Take 80 mg by mouth daily.     timolol (TIMOPTIC) 0.5 % ophthalmic solution 1 drop daily.     TRESIBA FLEXTOUCH 200 UNIT/ML FlexTouch Pen  Inject into the skin.     No current facility-administered medications for this visit.    PHYSICAL EXAMINATION:  ECOG PERFORMANCE STATUS: 0 - Asymptomatic   Vitals:   04/27/22 0844  BP: 138/76  Pulse: 77  Resp: 14  Temp: 97.6 F (36.4 C)  SpO2: 100%    Filed Weights   04/27/22 0844  Weight: 170 lb 14.4 oz (77.5 kg)     Physical Exam Vitals and nursing note reviewed.  Constitutional:      Appearance: Normal appearance. He is not diaphoretic.     Comments: Here alone.  Comfortable.  Fitzpatrick 1 skin tone.  Solar damage to skin.    HENT:     Head: Normocephalic and atraumatic.     Right Ear: External ear normal.     Left Ear: External ear normal.     Nose: Nose normal.  Eyes:     Conjunctiva/sclera: Conjunctivae normal.     Pupils: Pupils are equal, round, and reactive to light.  Cardiovascular:     Rate and Rhythm: Normal rate and regular rhythm.     Heart sounds:     No friction rub. No gallop.  Musculoskeletal:     Cervical back: Normal range of motion and neck supple. No rigidity or tenderness.  Lymphadenopathy:     Head:     Right side of head: No submental, submandibular, tonsillar, preauricular, posterior auricular or occipital adenopathy.     Left side of head: No submental, submandibular, tonsillar, preauricular, posterior auricular or occipital adenopathy.     Cervical: No cervical adenopathy.     Right cervical: No superficial, deep or posterior cervical adenopathy.    Left cervical: No superficial, deep or posterior cervical adenopathy.     Upper Body:     Right upper body: No supraclavicular or axillary adenopathy.     Left upper body: No supraclavicular or axillary adenopathy.     Lower Body: No right inguinal adenopathy. No left inguinal adenopathy.  Skin:    Coloration: Skin is not jaundiced.  Neurological:     General: No focal deficit present.     Mental  Status: He is alert and oriented to person, place, and time. Mental status is at  baseline.  Psychiatric:        Mood and Affect: Mood normal.        Behavior: Behavior normal.        Thought Content: Thought content normal.        Judgment: Judgment normal.     LABORATORY DATA: I have personally reviewed the data as listed:  Appointment on 04/27/2022  Component Date Value Ref Range Status   LDH 04/27/2022 141  98 - 192 U/L Final   Performed at [facility] [address] [place], [place] [address]  Office Visit on 04/27/2022  Component Date Value Ref Range Status   WBC 04/27/2022 7.6  4.0 - 10.5 K/uL Final   RBC 04/27/2022 4.32  4.22 - 5.81 MIL/uL Final   Hemoglobin 04/27/2022 9.2 (L)  13.0 - 17.0 g/dL Final   HCT 60/45/4098 32.7 (L)  39.0 - 52.0 % Final   MCV 04/27/2022 75.7 (L)  80.0 - 100.0 fL Final   MCH 04/27/2022 21.3 (L)  26.0 - 34.0 pg Final   MCHC 04/27/2022 28.1 (L)  30.0 - 36.0 g/dL Final   RDW 11/91/4782 16.7 (H)  11.5 - 15.5 % Final   Platelets 04/27/2022 320  150 - 400 K/uL Final   nRBC 04/27/2022 0.0  0.0 - 0.2 % Final   Neutrophils Relative % 04/27/2022 64  % Final   Neutro Abs 04/27/2022 4.9  1.7 - 7.7 K/uL Final   Lymphocytes Relative 04/27/2022 23  % Final   Lymphs Abs 04/27/2022 1.8  0.7 - 4.0 K/uL Final   Monocytes Relative 04/27/2022 8  % Final   Monocytes Absolute 04/27/2022 0.6  0.1 - 1.0 K/uL Final   Eosinophils Relative 04/27/2022 4  % Final   Eosinophils Absolute 04/27/2022 0.3  0.0 - 0.5 K/uL Final   Basophils Relative 04/27/2022 1  % Final   Basophils Absolute 04/27/2022 0.1  0.0 - 0.1 K/uL Final   Immature Granulocytes 04/27/2022 0  % Final   Abs Immature Granulocytes 04/27/2022 0.01  0.00 - 0.07 K/uL Final   Performed at [facility], [address], [place], [place] [address]   Sodium 04/27/2022 140  135 - 145 mmol/L Final   Potassium 04/27/2022 4.2  3.5 - 5.1 mmol/L Final   Chloride 04/27/2022 107  98 - 111 mmol/L Final   CO2 04/27/2022 24  22 - 32 mmol/L Final   Glucose, Bld  04/27/2022 173 (H)  70 - 99 mg/dL Final   Glucose reference range applies only to samples taken after fasting for at least 8 hours.   BUN 04/27/2022 19  8 - 23 mg/dL Final   Creatinine, Ser 04/27/2022 0.94  0.61 - 1.24 mg/dL Final   Calcium 30/86/5784 9.2  8.9 - 10.3 mg/dL Final   Total Protein 69/62/9528 7.3  6.5 - 8.1 g/dL Final   Albumin 41/32/4401 4.1  3.5 - 5.0 g/dL Final   AST 02/72/5366 24  15 - 41 U/L Final   ALT 04/27/2022 25  0 - 44 U/L Final   Alkaline Phosphatase 04/27/2022 95  38 - 126 U/L Final   Total Bilirubin 04/27/2022 0.6  0.3 - 1.2 mg/dL Final   GFR, Estimated 04/27/2022 >60  >60 mL/min Final   Comment: (NOTE) Calculated using the CKD-EPI Creatinine Equation (2021)    Anion gap 04/27/2022 9  5 - 15 Final   Performed at  [facility], [address], [place], [place] [address]    RADIOGRAPHIC STUDIES: I have personally reviewed the radiological images as listed and agree with the findings in the report  No results found.  ASSESSMENT/PLAN   67 y.o. male is here because of  history of malignant melanoma  Medical history notable for diabetes mellitus type 2, diabetic retinopathy, glaucoma, CVA, melanoma, hypertension,, dysplastic nevi and seborrheic keratosis  Malignant melanoma, left cervical lymph node:  Primary site not identified  July 2004- Underwent left neck dissection for management of left cervical adenopathy  September 2004- Began a year of adjuvant high dose IFN alpha which he was able to complete  Patient was followed by serial PET/CT's through December 2013 which showed no evidence of recurrence.     April 27 2022- No clinical evidence to suggest recurrence.  Should continue secondary prevention (skin exams, use of sunscreen, UV blocking)  Chronic tobacco use:  Discussed smoking cessation strategies.    CT Lung Screening:  Per the [place] Preventative Service Task Force annual screening for lung cancer with low-dose computed tomography  (LDCT) in adults aged 49 to 80 years who have a 20 pack-year smoking history and currently smoke or have quit within the past 15 years. Screening should be discontinued once a person has not smoked for 15 years or develops a health problem that substantially limits life expectancy or the ability or willingness to have curative lung surgery.  Patient meets criteria for screening.  Will refer for low dose CT chest.        Cancer Staging  No matching staging information was found for the patient.   No problem-specific Assessment & Plan notes found for this encounter.    Orders Placed This Encounter  Procedures   CT CHEST LUNG CA SCREEN LOW DOSE W/O CM    Standing Status:   Future    Standing Expiration Date:   04/27/2023    Order Specific Question:   Preferred Imaging Location?    Answer:   External   CBC with Differential/Platelet   Comprehensive metabolic panel   Lactate dehydrogenase    Standing Status:   Future    Number of Occurrences:   1    Standing Expiration Date:   04/27/2023   Ambulatory referral to Genetics    Referral Priority:   Routine    Referral Type:   Consultation    Referral Reason:   Specialty Services Required    Number of Visits Requested:   1    60  minutes was spent in patient care.  This included time spent preparing to see the patient (e.g., review of tests), obtaining and/or reviewing separately obtained history, counseling and educating the patient/family/caregiver, ordering medications, tests, or procedures; documenting clinical information in the electronic or other health record, independently interpreting results and communicating results to the patient/family/caregiver as well as coordination of care.       All questions were answered. The patient knows to call the clinic with any problems, questions or concerns.  This note was electronically signed.    Loni Muse, MD  05/03/2022 3:54 PM

## 2022-05-02 DIAGNOSIS — Z122 Encounter for screening for malignant neoplasm of respiratory organs: Secondary | ICD-10-CM | POA: Diagnosis not present

## 2022-05-02 DIAGNOSIS — Z87891 Personal history of nicotine dependence: Secondary | ICD-10-CM | POA: Diagnosis not present

## 2022-05-02 DIAGNOSIS — F17210 Nicotine dependence, cigarettes, uncomplicated: Secondary | ICD-10-CM | POA: Diagnosis not present

## 2022-05-03 DIAGNOSIS — Z9181 History of falling: Secondary | ICD-10-CM | POA: Diagnosis not present

## 2022-05-03 DIAGNOSIS — R202 Paresthesia of skin: Secondary | ICD-10-CM | POA: Diagnosis not present

## 2022-05-03 DIAGNOSIS — E785 Hyperlipidemia, unspecified: Secondary | ICD-10-CM | POA: Diagnosis not present

## 2022-05-03 DIAGNOSIS — E1169 Type 2 diabetes mellitus with other specified complication: Secondary | ICD-10-CM | POA: Diagnosis not present

## 2022-05-03 DIAGNOSIS — C439 Malignant melanoma of skin, unspecified: Secondary | ICD-10-CM | POA: Insufficient documentation

## 2022-05-03 DIAGNOSIS — I10 Essential (primary) hypertension: Secondary | ICD-10-CM | POA: Diagnosis not present

## 2022-05-03 DIAGNOSIS — E119 Type 2 diabetes mellitus without complications: Secondary | ICD-10-CM | POA: Diagnosis not present

## 2022-05-03 DIAGNOSIS — Z6824 Body mass index (BMI) 24.0-24.9, adult: Secondary | ICD-10-CM | POA: Diagnosis not present

## 2022-05-03 DIAGNOSIS — R200 Anesthesia of skin: Secondary | ICD-10-CM | POA: Diagnosis not present

## 2022-05-03 DIAGNOSIS — Z794 Long term (current) use of insulin: Secondary | ICD-10-CM | POA: Diagnosis not present

## 2022-05-03 DIAGNOSIS — M545 Low back pain, unspecified: Secondary | ICD-10-CM | POA: Diagnosis not present

## 2022-05-10 ENCOUNTER — Ambulatory Visit: Payer: Self-pay

## 2022-05-12 ENCOUNTER — Encounter: Payer: Self-pay | Admitting: "Endocrinology

## 2022-05-12 ENCOUNTER — Ambulatory Visit: Payer: Medicare Other | Admitting: "Endocrinology

## 2022-05-12 ENCOUNTER — Encounter: Payer: Self-pay | Admitting: Oncology

## 2022-05-12 VITALS — BP 138/70 | HR 89 | Ht 68.5 in | Wt 168.2 lb

## 2022-05-12 DIAGNOSIS — E7800 Pure hypercholesterolemia, unspecified: Secondary | ICD-10-CM

## 2022-05-12 DIAGNOSIS — Z7984 Long term (current) use of oral hypoglycemic drugs: Secondary | ICD-10-CM

## 2022-05-12 DIAGNOSIS — Z794 Long term (current) use of insulin: Secondary | ICD-10-CM

## 2022-05-12 DIAGNOSIS — E1165 Type 2 diabetes mellitus with hyperglycemia: Secondary | ICD-10-CM

## 2022-05-12 MED ORDER — INSULIN LISPRO (1 UNIT DIAL) 100 UNIT/ML (KWIKPEN): 5.0000 [IU] | PEN_INJECTOR | Freq: Three times a day (TID) | SUBCUTANEOUS | 1 refills | Status: DC

## 2022-05-12 NOTE — Patient Instructions (Addendum)
Tresiba 65 units  every morning Farxiga 10 mg qd Stat humalog 5 units three times a day FIFTEEN MIN before meals  __________   Goals of DM therapy:  Morning Fasting blood sugar: 80-140  Blood sugar before meals: 80-140 Bed time blood sugar: 100-150  A1C <7%, limited only by hypoglycemia  1.Diabetes medications and their side effects discussed, including hypoglycemia    2. Check blood glucose:  a) Always check blood sugars before driving. Please see below (under hypoglycemia) on how to manage b) Check a minimum of 3 times/day or more as needed when having symptoms of hypoglycemia.   c) Try to check blood glucose before sleeping/in the middle of the night to ensure that it is remaining stable and not dropping less than 100 d) Check blood glucose more often if sick  3. Diet: a) 3 meals per day schedule b: Restrict carbs to 60-70 grams (4 servings) per meal c) Colorful vegetables - 3 servings a day, and low sugar fruit 2 servings/day Plate control method: 1/4 plate protein, 1/4 starch, 1/2 green, yellow, or red vegetables d) Avoid carbohydrate snacks unless hypoglycemic episode, or increased physical activity  4. Regular exercise as tolerated, preferably 3 or more hours a week  5. Hypoglycemia: a)  Do not drive or operate machinery without first testing blood glucose to assure it is over 90 mg%, or if dizzy, lightheaded, not feeling normal, etc, or  if foot or leg is numb or weak. b)  If blood glucose less than 70, take four 5gm Glucose tabs or 15-30 gm Glucose gel.  Repeat every 15 min as needed until blood sugar is >100 mg/dl. If hypoglycemia persists then call 911.   6. Sick day management: a) Check blood glucose more often b) Continue usual therapy if blood sugars are elevated.   7. Contact the doctor immediately if blood glucose is frequently <60 mg/dl, or an episode of severe hypoglycemia occurs (where someone had to give you glucose/  glucagon or if you passed out from a  low blood glucose), or if blood glucose is persistently >350 mg/dl, for further management  8. A change in level of physical activity or exercise and a change in diet may also affect your blood sugar. Check blood sugars more often and call if needed.  Instructions: 1. Bring glucose meter, blood glucose records on every visit for review 2. Continue to follow up with primary care physician and other providers for medical care 3. Yearly eye  and foot exam 4. Please get blood work done prior to the next appointment

## 2022-05-12 NOTE — Progress Notes (Signed)
Outpatient Endocrinology Note Bobby Downey, MD  05/12/22   Bobby Downs 09-21-1955 [id number]  Referring Provider: Doran Stabler, * Primary Care Provider: Paulina Fusi, MD Reason for consultation: Subjective   Assessment & Plan  Diagnoses and all orders for this visit:  Uncontrolled type 2 diabetes mellitus with hyperglycemia (HCC) -     Microalbumin / creatinine urine ratio; Future  Long-term insulin use (HCC)  Long term (current) use of oral hypoglycemic drugs  Pure hypercholesterolemia  Other orders -     insulin lispro (HUMALOG KWIKPEN) 100 UNIT/ML KwikPen; Inject 5 Units into the skin 3 (three) times daily.    Diabetes complicated by neuropathy  Hba1c goal less than 7.0, current Hba1c is 8.9 . Will recommend for the following change of medications to: Tresiba 65 units every morning Farxiga 10 mg qd Stat humalog 5 units three times a day FIFTEEN MIN before meals  No known contraindications to any of above medications  Hyperlipidemia -Last LDL near goal: 83 -on pravastatin 80 mg QD -Follow low fat diet and exercise    -Blood pressure goal <140/90 - Microalbumin/creatinine not done, ordered it -On lisinopril 10 mg qd -diet changes including salt restriction -limit eating outside -counseled BP targets per standards of diabetes care -Uncontrolled blood pressure can lead to retinopathy, nephropathy and cardiovascular and atherosclerotic heart disease  Reviewed and counseled on: -A1C target -Blood sugar targets -Complications of uncontrolled diabetes  -Checking blood sugar before meals and bedtime and bring log next visit -All medications with mechanism of action and side effects -Hypoglycemia management: rule of 15's, Glucagon Emergency Kit and medical alert ID -low-carb low-fat plate-method diet -At least 20 minutes of physical activity per day -Annual dilated retinal eye exam and foot exam -compliance and follow up  needs -follow up as scheduled or earlier if problem gets worse  Call if blood sugar is less than 70 or consistently above 250    Take a 15 gm snack of carbohydrate at bedtime before you go to sleep if your blood sugar is less than 100.    If you are going to fast after midnight for a test or procedure, ask your physician for instructions on how to reduce/decrease your insulin dose.    Call if blood sugar is less than 70 or consistently above 250  -Treating a low sugar by rule of 15  (15 gms of sugar every 15 min until sugar is more than 70) If you feel your sugar is low, test your sugar to be sure If your sugar is low (less than 70), then take 15 grams of a fast acting Carbohydrate (3-4 glucose tablets or glucose gel or 4 ounces of juice or regular soda) Recheck your sugar 15 min after treating low to make sure it is more than 70 If sugar is still less than 70, treat again with 15 grams of carbohydrate          Don't drive the hour of hypoglycemia  If unconscious/unable to eat or drink by mouth, use glucagon injection or nasal spray baqsimi and call 911. Can repeat again in 15 min if still unconscious.  Return in about 4 weeks (around 06/09/2022) for DM, no labs.   I have reviewed current medications, nurse's notes, allergies, vital signs, past medical and surgical history, family medical history, and social history for this encounter. Counseled patient on symptoms, examination findings, lab findings, imaging results, treatment decisions and monitoring and prognosis. The patient understood the recommendations and  agrees with the treatment plan. All questions regarding treatment plan were fully answered.  Bobby Pentress, MD  05/12/22    History of Present Illness Bobby Downs is a 67 y.o. year old male who presents for evaluation of Type 2 diabetes mellitus.  Bobby Downs was first diagnosed around 2007.   Diabetes education +  Home diabetes regimen: Tresiba 70 units  qam Farxiga 10 mg qd  COMPLICATIONS + Stroke -  retinopathy, last eye exam 2023 +  neuropathy -  nephropathy  SYMPTOMS REVIEWED + Polyuria - Weight loss + Blurred vision   Labs reviewed  06/22/21 GFR 97 Cr 0.9 AST 12 ALT 18 Tg 85 HDL 51 VLDL 16 LDL 71    BLOOD SUGAR DATA  CGM interpretation: At today's visit, we reviewed her CGM downloads. The full report is scanned in the media. Reviewing the CGM trends, BG trend mostly high, specifically post supper    Physical Exam  BP 138/70 (BP Location: Left Arm, Patient Position: Sitting, Cuff Size: Normal)   Pulse 89   Ht 5' 8.5" (1.74 m)   Wt 168 lb 3.2 oz (76.3 kg)   SpO2 98%   BMI 25.20 kg/m    Constitutional: well developed, well nourished Head: normocephalic, atraumatic Eyes: sclera anicteric, no redness Neck: supple Lungs: normal respiratory effort Neurology: alert and oriented Skin: dry, no appreciable rashes Musculoskeletal: no appreciable defects Psychiatric: normal mood and affect Diabetic Foot Exam - Simple   Simple Foot Form Diabetic Foot exam was performed with the following findings: Yes 05/12/2022  2:56 PM  Visual Inspection No deformities, no ulcerations, no other skin breakdown bilaterally: Yes Sensation Testing Intact to touch and monofilament testing bilaterally: Yes Pulse Check Posterior Tibialis and Dorsalis pulse intact bilaterally: Yes Comments      Current Medications Patient's Medications  New Prescriptions   INSULIN LISPRO (HUMALOG KWIKPEN) 100 UNIT/ML KWIKPEN    Inject 5 Units into the skin 3 (three) times daily.  Previous Medications   ASPIRIN EC 81 MG TABLET    Take 81 mg by mouth daily. Swallow whole.   CLOPIDOGREL (PLAVIX) 75 MG TABLET    Take 75 mg by mouth daily.   FARXIGA 10 MG TABS TABLET    Take 10 mg by mouth daily.   LATANOPROST (XALATAN) 0.005 % OPHTHALMIC SOLUTION    1 drop at bedtime.   LISINOPRIL (ZESTRIL) 10 MG TABLET    Take 1 tablet by mouth daily.   MULTIPLE  VITAMIN (MULTIVITAMIN) CAPSULE    Take 1 capsule by mouth daily.   PRAVASTATIN (PRAVACHOL) 80 MG TABLET    Take 80 mg by mouth daily.   TIMOLOL (TIMOPTIC) 0.5 % OPHTHALMIC SOLUTION    1 drop daily.   TRESIBA FLEXTOUCH 200 UNIT/ML FLEXTOUCH PEN    Inject into the skin. Inject 7 units daily  Modified Medications   No medications on file  Discontinued Medications   No medications on file    Allergies No Known Allergies  Past Medical History Past Medical History:  Diagnosis Date   Diabetes mellitus without complication (HCC)    Diabetic retinopathy (HCC)    Glaucoma    History of CVA (cerebrovascular accident)    History of melanoma    History of tobacco use    Hypertension    Insulin dependent type 2 diabetes mellitus (HCC)     Past Surgical History Past Surgical History:  Procedure Laterality Date   DEEP NECK LYMPH NODE BIOPSY / EXCISION  Family History family history includes Brain cancer in his brother; Colon cancer in his mother; Hyperlipidemia in his mother; Hypertension in his mother; Seizures in his brother.  Social History Social History   Socioeconomic History   Marital status: Married    Spouse name: Not on file   Number of children: Not on file   Years of education: Not on file   Highest education level: Not on file  Occupational History   Not on file  Tobacco Use   Smoking status: Every Day    Packs/day: 1.00    Years: 40.00    Additional pack years: 0.00    Total pack years: 40.00    Types: Cigarettes   Smokeless tobacco: Never  Vaping Use   Vaping Use: Never used  Substance and Sexual Activity   Alcohol use: Not Currently   Drug use: Never   Sexual activity: Not on file  Other Topics Concern   Not on file  Social History Narrative   Not on file   Social Determinants of Health   Financial Resource Strain: Not on file  Food Insecurity: No Food Insecurity (04/27/2022)   Hunger Vital Sign    Worried About Running Out of Food in the Last  Year: Never true    Ran Out of Food in the Last Year: Never true  Transportation Needs: No Transportation Needs (04/27/2022)   PRAPARE - Administrator, Civil Service (Medical): No    Lack of Transportation (Non-Medical): No  Physical Activity: Not on file  Stress: Not on file  Social Connections: Not on file  Intimate Partner Violence: Not At Risk (04/27/2022)   Humiliation, Afraid, Rape, and Kick questionnaire    Fear of Current or Ex-Partner: No    Emotionally Abused: No    Physically Abused: No    Sexually Abused: No    No results found for: "HGBA1C" No results found for: "CHOL" No results found for: "HDL" No results found for: "LDLCALC" No results found for: "TRIG" No results found for: "CHOLHDL" Lab Results  Component Value Date   CREATININE 0.94 04/27/2022   No results found for: "GFR" No results found for: "MICROALBUR", "[id number]"    Component Value Date/Time   NA 140 04/27/2022 0938   K 4.2 04/27/2022 0938   CL 107 04/27/2022 0938   CO2 24 04/27/2022 0938   GLUCOSE 173 (H) 04/27/2022 0938   BUN 19 04/27/2022 0938   CREATININE 0.94 04/27/2022 0938   CALCIUM 9.2 04/27/2022 0938   PROT 7.3 04/27/2022 0938   ALBUMIN 4.1 04/27/2022 0938   AST 24 04/27/2022 0938   ALT 25 04/27/2022 0938   ALKPHOS 95 04/27/2022 0938   BILITOT 0.6 04/27/2022 0938   GFRNONAA >60 04/27/2022 0938      Latest Ref Rng & Units 04/27/2022    9:38 AM  BMP  Glucose 70 - 99 mg/dL 161   BUN 8 - 23 mg/dL 19   Creatinine 0.96 - 1.24 mg/dL 0.45   Sodium 409 - 811 mmol/L 140   Potassium 3.5 - 5.1 mmol/L 4.2   Chloride 98 - 111 mmol/L 107   CO2 22 - 32 mmol/L 24   Calcium 8.9 - 10.3 mg/dL 9.2        Component Value Date/Time   WBC 7.6 04/27/2022 0938   RBC 4.32 04/27/2022 0938   HGB 9.2 (L) 04/27/2022 0938   HCT 32.7 (L) 04/27/2022 0938   PLT 320 04/27/2022 0938   MCV 75.7 (L) 04/27/2022  1610   MCH 21.3 (L) 04/27/2022 0938   MCHC 28.1 (L) 04/27/2022 0938   RDW 16.7 (H)  04/27/2022 0938   LYMPHSABS 1.8 04/27/2022 0938   MONOABS 0.6 04/27/2022 0938   EOSABS 0.3 04/27/2022 0938   BASOSABS 0.1 04/27/2022 0938     Parts of this note may have been dictated using voice recognition software. There may be variances in spelling and vocabulary which are unintentional. Not all errors are proofread. Please notify the Thereasa Parkin if any discrepancies are noted or if the meaning of any statement is not clear.

## 2022-05-16 ENCOUNTER — Encounter: Payer: Self-pay | Admitting: "Endocrinology

## 2022-05-17 DIAGNOSIS — M5441 Lumbago with sciatica, right side: Secondary | ICD-10-CM | POA: Diagnosis not present

## 2022-05-17 DIAGNOSIS — G8929 Other chronic pain: Secondary | ICD-10-CM | POA: Diagnosis not present

## 2022-05-17 DIAGNOSIS — M5442 Lumbago with sciatica, left side: Secondary | ICD-10-CM | POA: Diagnosis not present

## 2022-05-17 DIAGNOSIS — E119 Type 2 diabetes mellitus without complications: Secondary | ICD-10-CM | POA: Diagnosis not present

## 2022-05-25 ENCOUNTER — Inpatient Hospital Stay: Payer: Medicare Other | Attending: Oncology | Admitting: Oncology

## 2022-05-25 ENCOUNTER — Inpatient Hospital Stay: Payer: Medicare Other

## 2022-05-25 VITALS — BP 113/67 | HR 80 | Temp 98.2°F | Resp 18 | Ht 68.5 in | Wt 166.9 lb

## 2022-05-25 DIAGNOSIS — E11319 Type 2 diabetes mellitus with unspecified diabetic retinopathy without macular edema: Secondary | ICD-10-CM | POA: Diagnosis not present

## 2022-05-25 DIAGNOSIS — Z808 Family history of malignant neoplasm of other organs or systems: Secondary | ICD-10-CM | POA: Diagnosis not present

## 2022-05-25 DIAGNOSIS — I10 Essential (primary) hypertension: Secondary | ICD-10-CM | POA: Insufficient documentation

## 2022-05-25 DIAGNOSIS — Z800 Family history of malignant neoplasm of digestive organs: Secondary | ICD-10-CM | POA: Diagnosis not present

## 2022-05-25 DIAGNOSIS — C4360 Malignant melanoma of unspecified upper limb, including shoulder: Secondary | ICD-10-CM

## 2022-05-25 DIAGNOSIS — Z803 Family history of malignant neoplasm of breast: Secondary | ICD-10-CM | POA: Diagnosis not present

## 2022-05-25 DIAGNOSIS — Z720 Tobacco use: Secondary | ICD-10-CM | POA: Diagnosis not present

## 2022-05-25 DIAGNOSIS — Z716 Tobacco abuse counseling: Secondary | ICD-10-CM

## 2022-05-25 DIAGNOSIS — D539 Nutritional anemia, unspecified: Secondary | ICD-10-CM | POA: Diagnosis not present

## 2022-05-25 DIAGNOSIS — D509 Iron deficiency anemia, unspecified: Secondary | ICD-10-CM | POA: Insufficient documentation

## 2022-05-25 DIAGNOSIS — C434 Malignant melanoma of scalp and neck: Secondary | ICD-10-CM | POA: Insufficient documentation

## 2022-05-25 DIAGNOSIS — F17210 Nicotine dependence, cigarettes, uncomplicated: Secondary | ICD-10-CM | POA: Diagnosis not present

## 2022-05-25 LAB — DIRECT ANTIGLOBULIN TEST (NOT AT ARMC)
DAT, IgG: NEGATIVE
DAT, complement: NEGATIVE

## 2022-05-25 LAB — VITAMIN B12: Vitamin B-12: 468 pg/mL (ref 180–914)

## 2022-05-25 LAB — RETICULOCYTES
Immature Retic Fract: 39.2 % — ABNORMAL HIGH (ref 2.3–15.9)
RBC.: 4.21 MIL/uL — ABNORMAL LOW (ref 4.22–5.81)
Retic Count, Absolute: 85 10*3/uL (ref 19.0–186.0)
Retic Ct Pct: 2 % (ref 0.4–3.1)

## 2022-05-25 LAB — FOLATE: Folate: 31.6 ng/mL (ref >5.9)

## 2022-05-25 LAB — FERRITIN: Ferritin: 3 ng/mL — ABNORMAL LOW (ref 24–336)

## 2022-05-25 NOTE — Progress Notes (Signed)
[facility] Cancer Follow up Visit:  Patient Care Team: Paulina Fusi, MD as PCP - General (Internal Medicine)  CHIEF COMPLAINTS/PURPOSE OF CONSULTATION:  Oncology History  Malignant melanoma (HCC)  05/03/2022 Initial Diagnosis   Malignant melanoma (HCC)     HISTORY OF PRESENTING ILLNESS: Bobby Downs 67 y.o. male is here because of  history of malignant melanoma Medical history notable for diabetes mellitus type 2, diabetic retinopathy, glaucoma, CVA, melanoma, hypertension,, dysplastic nevi and seborrheic keratosis  July 10, 2002: Noted 1 year of swelling in left neck.  MRI of the neck was performed that showed an enlarged lymph node.  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  Subsequently underwent left neck dissection.  Clinically involved LN was consistent with melanoma September 19 2002:  PET showed some hypermetabolic activity in his post-operative site but was otherwise negative.  September 30 2002:  [facility] Consult.  High dose Interferon alpha given at 20 MIU per m2 daily Monday through Friday for 4 weeks and then subcutaneously at 10 MIU per m2 3 times a week for 11 months was recommended October 09 2002:  Pathology review at [facility].  He had 1 lymph node with a 3 cm deposit of disease and 7 negative lymph nodes resected.  October 25 2002:  MRI brain and CT CAP negative for metastasis.   October 28 2002:  Began adjuvant interferon November 03 2003:  Completed Adjuvant IFN July 11 2006:  PET/CT a new tiny left leg cutaneous lesion with mild FDG uptake is thought to be benign; stable subcentimeter non-FDG avid lung nodules in the right middle and left lower lobes. January 17 2007:  PET/CT no evidence for metastatic disease; there is a slight enlargement of a left axillary lymph node that was not hypermetabolic and stable indeterminate subcentimeter pulmonary nodules in the right middle lobe and left lobe. January 16 2008:  PET CT no evidence of  recurrence; there are several stable FDG negative pulmonary nodules. January 29 2009:  FNA of left cervical LN's negative for malignancy May 05 2009- Right neck dissection .  Pathology negative for malignancy December 23 2010:  PET/CT negative for recurrence December 16 2011:  PET CT Small inferior right axillary lymph node which demonstrates mild  FDG-uptake which is nonspecific; benign etiology favored. Tiny indeterminate right upper lobe pulmonary nodule.   April 27 2022:   Medical Oncology Consult Patient states that in 2004 he noted emergence of a left cervical lymph node which was initially evaluated in [place], [place].  He was subsequently referred to [facility] and underwent cervical lymphadenectomy which lead to the diagnosis of malignant melanoma.  He subsequently went to [facility] for a second opinion and received 1 year of adjuvant interferon.  The primary site was never identified.   Following the surgeries he was on surveillance with imaging but had to stop going because he lost his job and insurance  He is also followed by dermatology Hobbies are playing golf.  History of sunburns while in the [employer] and spent a lot of time outside as a child.    Social:  Married.  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  Worked at [employer] then [employer].  Smokes 1 ppd of cigarettes.  EtOH none  [facility] Mother died 27 DM Type II, colon cancer Estranged from biological father Sister alive 59 breast cancer Sister alive 56 well but has history of hearing problems.  WBC 7.6 hemoglobin 9.2 MCV 76 platelet count 320; 64 segs 23 lymphs 8 monos 4 eos 1 basophil MP notable for glucose of 173.  LDH 141  April 29, 2022: CT lung screening.  Small pulmonary nodule in periphery of right middle lobe 4.8 mm.  Lung RADS 2S.  May 25, 2022: Scheduled follow-up for melanoma.  Reviewed results of labs and CT scan with patient.  Patient is on dual antiplatelet therapy with  aspirin and Plavix.  Last colonoscopy was several years ago.  Per patient has not undergone EGD.  Will draw labs and refer to GI for consideration of endoscopy  Review of Systems  Constitutional:  Negative for appetite change, chills, fatigue, fever and unexpected weight change.       Occasional night sweats  HENT:   Negative for mouth sores, nosebleeds, sore throat and voice change.        Occasionally has trouble swallowing   Eyes:  Negative for eye problems and icterus.       Vision changes:  None  Respiratory:  Negative for chest tightness, cough, hemoptysis, shortness of breath and wheezing.        PND:  none Orthopnea:  none DOE:    Cardiovascular:  Negative for chest pain and leg swelling.       Intermittent palpitations  Gastrointestinal:  Negative for abdominal pain, blood in stool, constipation, diarrhea, nausea and vomiting.  Endocrine: Negative for hot flashes.       Cold intolerance:  none Heat intolerance:  none  Genitourinary:  Negative for bladder incontinence, difficulty urinating, dysuria, frequency, hematuria and nocturia.   Musculoskeletal:  Negative for arthralgias, gait problem, myalgias, neck pain and neck stiffness.       Chronic mild lumbar pain  Skin:  Negative for itching, rash and wound.  Neurological:  Negative for dizziness, gait problem, headaches, light-headedness, seizures and speech difficulty.       Has some weakness in left leg which began within the past year.  Has neuropathy of hands and feet  Hematological:  Negative for adenopathy. Does not bruise/bleed easily.  Psychiatric/Behavioral:  Negative for sleep disturbance and suicidal ideas. The patient is not nervous/anxious.     MEDICAL HISTORY: Past Medical History:  Diagnosis Date   Diabetes mellitus without complication (HCC)    Diabetic retinopathy (HCC)    Glaucoma    History of CVA (cerebrovascular accident)    History of melanoma    History of tobacco use    Hypertension    Insulin  dependent type 2 diabetes mellitus (HCC)     SURGICAL HISTORY: Past Surgical History:  Procedure Laterality Date   DEEP NECK LYMPH NODE BIOPSY / EXCISION      SOCIAL HISTORY: Social History   Socioeconomic History   Marital status: Married    Spouse name: Not on file   Number of children: Not on file   Years of education: Not on file   Highest education level: Not on file  Occupational History   Not on file  Tobacco Use   Smoking status: Every Day    Packs/day: 1.00    Years: 40.00    Additional pack years: 0.00    Total pack years: 40.00    Types: Cigarettes   Smokeless tobacco: Never  Vaping Use   Vaping Use: Never used  Substance and Sexual Activity   Alcohol use: Not Currently   Drug use: Never   Sexual activity: Not on file  Other Topics Concern  Not on file  Social History Narrative   Not on file   Social Determinants of Health   Financial Resource Strain: Not on file  Food Insecurity: No Food Insecurity (04/27/2022)   Hunger Vital Sign    Worried About Running Out of Food in the Last Year: Never true    Ran Out of Food in the Last Year: Never true  Transportation Needs: No Transportation Needs (04/27/2022)   PRAPARE - Administrator, Civil Service (Medical): No    Lack of Transportation (Non-Medical): No  Physical Activity: Not on file  Stress: Not on file  Social Connections: Not on file  Intimate Partner Violence: Not At Risk (04/27/2022)   Humiliation, Afraid, Rape, and Kick questionnaire    Fear of Current or Ex-Partner: No    Emotionally Abused: No    Physically Abused: No    Sexually Abused: No    FAMILY HISTORY Family History  Problem Relation Age of Onset   Hypertension Mother    Hyperlipidemia Mother    Colon cancer Mother    Seizures Brother    Brain cancer Brother     ALLERGIES:  has No Known Allergies.  MEDICATIONS:  Current Outpatient Medications  Medication Sig Dispense Refill   aspirin EC 81 MG tablet Take 81  mg by mouth daily. Swallow whole.     clopidogrel (PLAVIX) 75 MG tablet Take 75 mg by mouth daily.     FARXIGA 10 MG TABS tablet Take 10 mg by mouth daily.     insulin lispro (HUMALOG KWIKPEN) 100 UNIT/ML KwikPen Inject 5 Units into the skin 3 (three) times daily. 15 mL 1   latanoprost (XALATAN) 0.005 % ophthalmic solution 1 drop at bedtime.     lisinopril (ZESTRIL) 10 MG tablet Take 1 tablet by mouth daily.     Multiple Vitamin (MULTIVITAMIN) capsule Take 1 capsule by mouth daily.     pravastatin (PRAVACHOL) 80 MG tablet Take 80 mg by mouth daily.     timolol (TIMOPTIC) 0.5 % ophthalmic solution 1 drop daily.     TRESIBA FLEXTOUCH 200 UNIT/ML FlexTouch Pen Inject into the skin. Inject 7 units daily     No current facility-administered medications for this visit.    PHYSICAL EXAMINATION:  ECOG PERFORMANCE STATUS: 0 - Asymptomatic   Vitals:   05/25/22 0929  BP: 113/67  Pulse: 80  Resp: 18  Temp: 98.2 F (36.8 C)  SpO2: 100%    Filed Weights   05/25/22 0929  Weight: 166 lb 14.4 oz (75.7 kg)     Physical Exam Vitals and nursing note reviewed.  Constitutional:      Appearance: Normal appearance. He is not diaphoretic.     Comments: Here alone.  Comfortable.  Fitzpatrick 1 skin tone.  Solar damage to skin.    HENT:     Head: Normocephalic and atraumatic.     Right Ear: External ear normal.     Left Ear: External ear normal.     Nose: Nose normal.  Eyes:     Conjunctiva/sclera: Conjunctivae normal.     Pupils: Pupils are equal, round, and reactive to light.  Cardiovascular:     Rate and Rhythm: Normal rate and regular rhythm.     Heart sounds:     No friction rub. No gallop.  Musculoskeletal:     Cervical back: Normal range of motion and neck supple. No rigidity or tenderness.  Lymphadenopathy:     Head:     Right side  of head: No submental, submandibular, tonsillar, preauricular, posterior auricular or occipital adenopathy.     Left side of head: No submental,  submandibular, tonsillar, preauricular, posterior auricular or occipital adenopathy.     Cervical: No cervical adenopathy.     Right cervical: No superficial, deep or posterior cervical adenopathy.    Left cervical: No superficial, deep or posterior cervical adenopathy.     Upper Body:     Right upper body: No supraclavicular or axillary adenopathy.     Left upper body: No supraclavicular or axillary adenopathy.     Lower Body: No right inguinal adenopathy. No left inguinal adenopathy.  Skin:    Coloration: Skin is not jaundiced.  Neurological:     General: No focal deficit present.     Mental Status: He is alert and oriented to person, place, and time. Mental status is at baseline.  Psychiatric:        Mood and Affect: Mood normal.        Behavior: Behavior normal.        Thought Content: Thought content normal.        Judgment: Judgment normal.     LABORATORY DATA: I have personally reviewed the data as listed:  Appointment on 04/27/2022  Component Date Value Ref Range Status   LDH 04/27/2022 141  98 - 192 U/L Final   Performed at [facility], [address], [place], [place] [address]  Office Visit on 04/27/2022  Component Date Value Ref Range Status   WBC 04/27/2022 7.6  4.0 - 10.5 K/uL Final   RBC 04/27/2022 4.32  4.22 - 5.81 MIL/uL Final   Hemoglobin 04/27/2022 9.2 (L)  13.0 - 17.0 g/dL Final   HCT 60/45/4098 32.7 (L)  39.0 - 52.0 % Final   MCV 04/27/2022 75.7 (L)  80.0 - 100.0 fL Final   MCH 04/27/2022 21.3 (L)  26.0 - 34.0 pg Final   MCHC 04/27/2022 28.1 (L)  30.0 - 36.0 g/dL Final   RDW 11/91/4782 16.7 (H)  11.5 - 15.5 % Final   Platelets 04/27/2022 320  150 - 400 K/uL Final   nRBC 04/27/2022 0.0  0.0 - 0.2 % Final   Neutrophils Relative % 04/27/2022 64  % Final   Neutro Abs 04/27/2022 4.9  1.7 - 7.7 K/uL Final   Lymphocytes Relative 04/27/2022 23  % Final   Lymphs Abs 04/27/2022 1.8  0.7 - 4.0 K/uL Final   Monocytes Relative 04/27/2022 8  %  Final   Monocytes Absolute 04/27/2022 0.6  0.1 - 1.0 K/uL Final   Eosinophils Relative 04/27/2022 4  % Final   Eosinophils Absolute 04/27/2022 0.3  0.0 - 0.5 K/uL Final   Basophils Relative 04/27/2022 1  % Final   Basophils Absolute 04/27/2022 0.1  0.0 - 0.1 K/uL Final   Immature Granulocytes 04/27/2022 0  % Final   Abs Immature Granulocytes 04/27/2022 0.01  0.00 - 0.07 K/uL Final   Performed at [facility], [address] [place], [place] [address]   Sodium 04/27/2022 140  135 - 145 mmol/L Final   Potassium 04/27/2022 4.2  3.5 - 5.1 mmol/L Final   Chloride 04/27/2022 107  98 - 111 mmol/L Final   CO2 04/27/2022 24  22 - 32 mmol/L Final   Glucose, Bld 04/27/2022 173 (H)  70 - 99 mg/dL Final   Glucose reference range applies only to samples taken after fasting for at least 8 hours.   BUN 04/27/2022 19  8 -  23 mg/dL Final   Creatinine, Ser 04/27/2022 0.94  0.61 - 1.24 mg/dL Final   Calcium 40/98/1191 9.2  8.9 - 10.3 mg/dL Final   Total Protein 47/82/9562 7.3  6.5 - 8.1 g/dL Final   Albumin 13/08/6576 4.1  3.5 - 5.0 g/dL Final   AST 46/96/2952 24  15 - 41 U/L Final   ALT 04/27/2022 25  0 - 44 U/L Final   Alkaline Phosphatase 04/27/2022 95  38 - 126 U/L Final   Total Bilirubin 04/27/2022 0.6  0.3 - 1.2 mg/dL Final   GFR, Estimated 04/27/2022 >60  >60 mL/min Final   Comment: (NOTE) Calculated using the CKD-EPI Creatinine Equation (2021)    Anion gap 04/27/2022 9  5 - 15 Final   Performed at [facility], [address], [place], [place] [address]    RADIOGRAPHIC STUDIES: I have personally reviewed the radiological images as listed and agree with the findings in the report  No results found.  ASSESSMENT/PLAN   67 y.o. male is here because of  history of malignant melanoma  Medical history notable for diabetes mellitus type 2, diabetic retinopathy, glaucoma, CVA, melanoma, hypertension,, dysplastic nevi and seborrheic keratosis  Malignant  melanoma, left cervical lymph node:  Primary site not identified  July 2004- Underwent left neck dissection for management of left cervical adenopathy  September 2004- Began a year of adjuvant high dose IFN alpha which he was able to complete  Patient was followed by serial PET/CT's through December 2013 which showed no evidence of recurrence.     April 27 2022- No clinical evidence to suggest recurrence.  Should continue secondary prevention (skin exams, use of sunscreen, UV blocking)  Chronic tobacco use:  Discussed smoking cessation strategies.    May 25 2022- Reiterated importance of smoking cessation  CT Lung Screening:    April 29 2022- CT lung screening LUNG RADS 26.  Follow up CT in 1 year  Microcytic anemia  April 27 2022.  Hgb 9.2 MCV 76.  At risk for occult GI bleeding due to dual antiplatelet therapy  May 2024- Will obtain additional lab studies and arrange for GI consult.  Will consider IV iron.      Cancer Staging  No matching staging information was found for the patient.   No problem-specific Assessment & Plan notes found for this encounter.    No orders of the defined types were placed in this encounter.   40  minutes was spent in patient care.  This included time spent preparing to see the patient (e.g., review of tests), obtaining and/or reviewing separately obtained history, counseling and educating the patient/family/caregiver, ordering medications, tests, or procedures; documenting clinical information in the electronic or other health record, independently interpreting results and communicating results to the patient/family/caregiver as well as coordination of care.       All questions were answered. The patient knows to call the clinic with any problems, questions or concerns.  This note was electronically signed.    Loni Muse, MD  05/25/2022 9:50 AM

## 2022-05-26 LAB — KAPPA/LAMBDA LIGHT CHAINS
Kappa free light chain: 22 mg/L — ABNORMAL HIGH (ref 3.3–19.4)
Kappa, lambda light chain ratio: 1.49 (ref 0.26–1.65)
Lambda free light chains: 14.8 mg/L (ref 5.7–26.3)

## 2022-05-26 LAB — HAPTOGLOBIN: Haptoglobin: 187 mg/dL (ref 32–363)

## 2022-05-27 LAB — ZINC: Zinc: 148 ug/dL — ABNORMAL HIGH (ref 44–115)

## 2022-05-27 LAB — COPPER, SERUM: Copper: 109 ug/dL (ref 69–132)

## 2022-06-02 DIAGNOSIS — M5442 Lumbago with sciatica, left side: Secondary | ICD-10-CM | POA: Diagnosis not present

## 2022-06-02 DIAGNOSIS — R200 Anesthesia of skin: Secondary | ICD-10-CM | POA: Diagnosis not present

## 2022-06-02 DIAGNOSIS — R202 Paresthesia of skin: Secondary | ICD-10-CM | POA: Diagnosis not present

## 2022-06-03 DIAGNOSIS — I10 Essential (primary) hypertension: Secondary | ICD-10-CM | POA: Diagnosis not present

## 2022-06-03 DIAGNOSIS — E119 Type 2 diabetes mellitus without complications: Secondary | ICD-10-CM | POA: Diagnosis not present

## 2022-06-03 DIAGNOSIS — E1169 Type 2 diabetes mellitus with other specified complication: Secondary | ICD-10-CM | POA: Diagnosis not present

## 2022-06-03 LAB — MULTIPLE MYELOMA PANEL, SERUM
Albumin SerPl Elph-Mcnc: 3.6 g/dL (ref 2.9–4.4)
Albumin/Glob SerPl: 1.4 (ref 0.7–1.7)
Alpha 1: 0.2 g/dL (ref 0.0–0.4)
Alpha2 Glob SerPl Elph-Mcnc: 0.8 g/dL (ref 0.4–1.0)
B-Globulin SerPl Elph-Mcnc: 1 g/dL (ref 0.7–1.3)
Gamma Glob SerPl Elph-Mcnc: 0.7 g/dL (ref 0.4–1.8)
Globulin, Total: 2.7 g/dL (ref 2.2–3.9)
IgA: 177 mg/dL (ref 61–437)
IgG (Immunoglobin G), Serum: 674 mg/dL (ref 603–1613)
IgM (Immunoglobulin M), Srm: 62 mg/dL (ref 20–172)
Total Protein ELP: 6.3 g/dL (ref 6.0–8.5)

## 2022-06-09 ENCOUNTER — Encounter: Payer: Self-pay | Admitting: "Endocrinology

## 2022-06-09 ENCOUNTER — Ambulatory Visit: Payer: Medicare Other | Admitting: "Endocrinology

## 2022-06-09 VITALS — BP 120/70 | HR 82 | Ht 68.5 in | Wt 168.0 lb

## 2022-06-09 DIAGNOSIS — Z794 Long term (current) use of insulin: Secondary | ICD-10-CM

## 2022-06-09 DIAGNOSIS — Z7984 Long term (current) use of oral hypoglycemic drugs: Secondary | ICD-10-CM | POA: Diagnosis not present

## 2022-06-09 DIAGNOSIS — E7800 Pure hypercholesterolemia, unspecified: Secondary | ICD-10-CM | POA: Diagnosis not present

## 2022-06-09 DIAGNOSIS — E1165 Type 2 diabetes mellitus with hyperglycemia: Secondary | ICD-10-CM | POA: Diagnosis not present

## 2022-06-09 LAB — POCT GLYCOSYLATED HEMOGLOBIN (HGB A1C): Hemoglobin A1C: 8.5 % — AB (ref 4.0–5.6)

## 2022-06-09 NOTE — Progress Notes (Signed)
REFERRING PROVIDER: Loni Muse, MD [address] [place],  [place] [address]  PRIMARY PROVIDER:  Paulina Fusi, MD  PRIMARY REASON FOR VISIT:  Encounter Diagnoses  Name Primary?   Malignant melanoma  Yes   Family history of breast cancer      HISTORY OF PRESENT ILLNESS:   Bobby Downs, a 67 y.o. male, was seen for a  cancer genetics consultation at the request of Dr. Angelene Giovanni due to a personal and family history of cancer.  Bobby Downs presents to clinic today to discuss the possibility of a hereditary predisposition to cancer, to discuss genetic testing, and to further clarify his future cancer risks, as well as potential cancer risks for family members.   In his mid 13s, Bobby Downs was diagnose with malignant melanoma.  He reports extensive sun exposure and sunburns early in life.   CANCER HISTORY:  Oncology History  Malignant melanoma (HCC)  05/03/2022 Initial Diagnosis   Malignant melanoma (HCC)      Past Medical History:  Diagnosis Date   Diabetes mellitus without complication (HCC)    Diabetic retinopathy (HCC)    Glaucoma    History of CVA (cerebrovascular accident)    History of melanoma    History of tobacco use    Hypertension    Insulin dependent type 2 diabetes mellitus (HCC)     Past Surgical History:  Procedure Laterality Date   DEEP NECK LYMPH NODE BIOPSY / EXCISION      FAMILY HISTORY:  We obtained a detailed, 4-generation family history.  Significant diagnoses are listed below: Family History  Problem Relation Age of Onset   Colon cancer Mother 54   Breast cancer Mother        dx <50   Breast cancer Sister 33   Brain cancer Brother 25   Cancer Maternal Grandfather        unknown type      Bobby Downs is unaware of previous family history of genetic testing for hereditary cancer risks. There is no reported Ashkenazi Jewish ancestry. There is no known consanguinity.  GENETIC COUNSELING ASSESSMENT: Bobby Downs  is a 67 y.o. male with a personal and family history which is somewhat suggestive of a hereditary cancer syndrome given the presence of related cancers in the family (melanoma, premenopausal breast cancer).  We, therefore, discussed and recommended the following at today's visit.   DISCUSSION: We discussed that 5 - 10% of cancer is hereditary.  Hereditary melanoma can be associated with mutations in genes including but not limited to BRCA2 and CDKN2A.  There are other genes that can be associated with hereditary melanoma, breast, or colon cancer syndromes.  We discussed that testing is beneficial for several reasons including knowing how to follow individuals for their cancer risks and understanding if other family members could be at risk for cancer and allowing them to undergo genetic testing.   We reviewed the characteristics, features and inheritance patterns of hereditary cancer syndromes. We also discussed genetic testing, including the appropriate family members to test, the process of testing, insurance coverage and turn-around-time for results. We discussed the implications of a negative, positive, carrier and/or variant of uncertain significant result. We recommended Bobby Downs pursue genetic testing for a panel that includes genes associated with melanoma, breast cancer, colon cancer, and other cancers.   Based on Bobby Downs's family history of breast cancer before age 51, he meets medical criteria for genetic testing.    PLAN: Bobby Downs did not  wish to pursue genetic testing at today's visit.  He is not overly concerned about a hereditary cancer syndrome contributing to his melanoma given his history of sun exposure.  He stated he may be interested in genetic testing in the future but wishes to speak with his family before deciding. We understand this decision and remain available to coordinate genetic testing at any time in the future.   We, therefore, recommend Bobby Downs continue  to follow the cancer screening guidelines given by his oncology and primary healthcare providers.  We discussed that his sister, given her history of breast cancer and her mother's history of premenopausal breast cancer, is a also a candidate for genetic testing/counseling.   Lastly, we encouraged Bobby Downs to remain in contact with cancer genetics annually so that we can continuously update the family history and inform him of any changes in cancer genetics and testing that may be of benefit for this family.   Bobby Downs's questions were answered to his satisfaction today. Our contact information was provided should additional questions or concerns arise. Thank you for the referral and allowing [place] to share in the care of your patient.   Matheus Spiker M. Rennie Plowman, MS, [facility] Genetic Counselor [email or web address] (P) [phone]  The patient was seen for a total of 30 minutes in face-to-face genetic counseling.  The patient was seen alone.  Dr. Gilman Buttner was available to discuss this case as needed.    _______________________________________________________________________ For Office Staff:  Number of people involved in session: 1 Was an Intern/ student involved with case: no

## 2022-06-09 NOTE — Progress Notes (Signed)
Outpatient Endocrinology Note Bobby [place], MD  06/09/22   Bobby Downs 23-May-1955 [id number]  Referring Provider: Paulina Fusi, MD Primary Care Provider: Paulina Fusi, MD Reason for consultation: Subjective   Assessment & Plan  Diagnoses and all orders for this visit:  Uncontrolled type 2 diabetes mellitus with hyperglycemia (HCC) -     POCT glycosylated hemoglobin (Hb A1C)  Long-term insulin use (HCC)  Long term (current) use of oral hypoglycemic drugs  Pure hypercholesterolemia     Diabetes complicated by neuropathy  Hba1c goal less than 7.0, current Hba1c is 8.9. Will recommend for the following change of medications to: Tresiba 65 units once every day Humalog 5 units three times a day 15 min before each meal (take 3 units for small meal, 7 units for a big meal). Do not take if sugar is less than 100 Farxiga 10 mg once a day FIFTEEN MIN before meals  No known contraindications to any of above medications  Hyperlipidemia -Last LDL near goal: 83 -on pravastatin 80 mg QD -Follow low fat diet and exercise   -Blood pressure goal <140/90 - Microalbumin/creatinine not done, ordered it -On lisinopril 10 mg qd -diet changes including salt restriction -limit eating outside -counseled BP targets per standards of diabetes care -Uncontrolled blood pressure can lead to retinopathy, nephropathy and cardiovascular and atherosclerotic heart disease  Reviewed and counseled on: -A1C target -Blood sugar targets -Complications of uncontrolled diabetes  -Checking blood sugar before meals and bedtime and bring log next visit -All medications with mechanism of action and side effects -Hypoglycemia management: rule of 15's, Glucagon Emergency Kit and medical alert ID -low-carb low-fat plate-method diet -At least 20 minutes of physical activity per day -Annual dilated retinal eye exam and foot exam -compliance and follow up needs -follow up as  scheduled or earlier if problem gets worse  Call if blood sugar is less than 70 or consistently above 250    Take a 15 gm snack of carbohydrate at bedtime before you go to sleep if your blood sugar is less than 100.    If you are going to fast after midnight for a test or procedure, ask your physician for instructions on how to reduce/decrease your insulin dose.    Call if blood sugar is less than 70 or consistently above 250  -Treating a low sugar by rule of 15  (15 gms of sugar every 15 min until sugar is more than 70) If you feel your sugar is low, test your sugar to be sure If your sugar is low (less than 70), then take 15 grams of a fast acting Carbohydrate (3-4 glucose tablets or glucose gel or 4 ounces of juice or regular soda) Recheck your sugar 15 min after treating low to make sure it is more than 70 If sugar is still less than 70, treat again with 15 grams of carbohydrate          Don't drive the hour of hypoglycemia  If unconscious/unable to eat or drink by mouth, use glucagon injection or nasal spray baqsimi and call 911. Can repeat again in 15 min if still unconscious.  Return in about 2 months (around 08/09/2022) for visit.   I have reviewed current medications, nurse's notes, allergies, vital signs, past medical and surgical history, family medical history, and social history for this encounter. Counseled patient on symptoms, examination findings, lab findings, imaging results, treatment decisions and monitoring and prognosis. The patient understood the recommendations and agrees  with the treatment plan. All questions regarding treatment plan were fully answered.  Bobby Weiser, MD  06/09/22   History of Present Illness Bobby Downs is a 67 y.o. year old male who presents for evaluation of Type 2 diabetes mellitus.  Bobby Downs was first diagnosed around 2007.   Diabetes education +  Home diabetes regimen: Tresiba 65 units qam Humalog 5 units  tidac Farxiga 10 mg qd  COMPLICATIONS + Stroke -  retinopathy, last eye exam 2023 +  neuropathy -  nephropathy  Labs reviewed  06/22/21 GFR 97 Cr 0.9 AST 12 ALT 18 Tg 85 HDL 51 VLDL 16 LDL 71    BLOOD SUGAR DATA  CGM interpretation: At today's visit, we reviewed her CGM downloads. The full report is scanned in the media. Reviewing the CGM trends, BG still trending high with some random lows.  Physical Exam  BP 120/70   Pulse 82   Ht 5' 8.5" (1.74 m)   Wt 168 lb (76.2 kg)   SpO2 94%   BMI 25.17 kg/m    Constitutional: well developed, well nourished Head: normocephalic, atraumatic Eyes: sclera anicteric, no redness Neck: supple Lungs: normal respiratory effort Neurology: alert and oriented Skin: dry, no appreciable rashes Musculoskeletal: no appreciable defects Psychiatric: normal mood and affect    Current Medications Patient's Medications  New Prescriptions   No medications on file  Previous Medications   ASPIRIN EC 81 MG TABLET    Take 81 mg by mouth daily. Swallow whole.   CLOPIDOGREL (PLAVIX) 75 MG TABLET    Take 75 mg by mouth daily.   FARXIGA 10 MG TABS TABLET    Take 10 mg by mouth daily.   INSULIN LISPRO (HUMALOG KWIKPEN) 100 UNIT/ML KWIKPEN    Inject 5 Units into the skin 3 (three) times daily.   LATANOPROST (XALATAN) 0.005 % OPHTHALMIC SOLUTION    1 drop at bedtime.   LISINOPRIL (ZESTRIL) 10 MG TABLET    Take 1 tablet by mouth daily.   MULTIPLE VITAMIN (MULTIVITAMIN) CAPSULE    Take 1 capsule by mouth daily.   PRAVASTATIN (PRAVACHOL) 80 MG TABLET    Take 80 mg by mouth daily.   TIMOLOL (TIMOPTIC) 0.5 % OPHTHALMIC SOLUTION    1 drop daily.   TRESIBA FLEXTOUCH 200 UNIT/ML FLEXTOUCH PEN    Inject into the skin. Inject 7 units daily  Modified Medications   No medications on file  Discontinued Medications   No medications on file    Allergies No Known Allergies  Past Medical History Past Medical History:  Diagnosis Date   Diabetes mellitus  without complication (HCC)    Diabetic retinopathy (HCC)    Glaucoma    History of CVA (cerebrovascular accident)    History of melanoma    History of tobacco use    Hypertension    Insulin dependent type 2 diabetes mellitus (HCC)     Past Surgical History Past Surgical History:  Procedure Laterality Date   DEEP NECK LYMPH NODE BIOPSY / EXCISION      Family History family history includes Brain cancer (age of onset: 68) in his brother; Breast cancer (age of onset: 26) in his sister; Cancer in his maternal grandfather; Colon cancer (age of onset: 20) in his mother; Diabetes type II in his mother; Hearing loss in his sister; Hyperlipidemia in his mother; Hypertension in his mother; Seizures in his brother.  Social History Social History   Socioeconomic History   Marital status: Married  Spouse name: Not on file   Number of children: Not on file   Years of education: Not on file   Highest education level: Not on file  Occupational History   Not on file  Tobacco Use   Smoking status: Every Day    Packs/day: 1.00    Years: 40.00    Additional pack years: 0.00    Total pack years: 40.00    Types: Cigarettes   Smokeless tobacco: Never  Vaping Use   Vaping Use: Never used  Substance and Sexual Activity   Alcohol use: Not Currently   Drug use: Never   Sexual activity: Not on file  Other Topics Concern   Not on file  Social History Narrative   Not on file   Social Determinants of Health   Financial Resource Strain: Not on file  Food Insecurity: No Food Insecurity (04/27/2022)   Hunger Vital Sign    Worried About Running Out of Food in the Last Year: Never true    Ran Out of Food in the Last Year: Never true  Transportation Needs: No Transportation Needs (04/27/2022)   PRAPARE - Administrator, Civil Service (Medical): No    Lack of Transportation (Non-Medical): No  Physical Activity: Not on file  Stress: Not on file  Social Connections: Not on file   Intimate Partner Violence: Not At Risk (04/27/2022)   Humiliation, Afraid, Rape, and Kick questionnaire    Fear of Current or Ex-Partner: No    Emotionally Abused: No    Physically Abused: No    Sexually Abused: No    Lab Results  Component Value Date   HGBA1C 8.5 (A) 06/09/2022   No results found for: "CHOL" No results found for: "HDL" No results found for: "LDLCALC" No results found for: "TRIG" No results found for: "CHOLHDL" Lab Results  Component Value Date   CREATININE 0.94 04/27/2022   No results found for: "GFR" No results found for: "MICROALBUR", "[id number]"    Component Value Date/Time   NA 140 04/27/2022 0938   K 4.2 04/27/2022 0938   CL 107 04/27/2022 0938   CO2 24 04/27/2022 0938   GLUCOSE 173 (H) 04/27/2022 0938   BUN 19 04/27/2022 0938   CREATININE 0.94 04/27/2022 0938   CALCIUM 9.2 04/27/2022 0938   PROT 7.3 04/27/2022 0938   ALBUMIN 4.1 04/27/2022 0938   AST 24 04/27/2022 0938   ALT 25 04/27/2022 0938   ALKPHOS 95 04/27/2022 0938   BILITOT 0.6 04/27/2022 0938   GFRNONAA >60 04/27/2022 0938      Latest Ref Rng & Units 04/27/2022    9:38 AM  BMP  Glucose 70 - 99 mg/dL 253   BUN 8 - 23 mg/dL 19   Creatinine 6.64 - 1.24 mg/dL 4.03   Sodium 474 - 259 mmol/L 140   Potassium 3.5 - 5.1 mmol/L 4.2   Chloride 98 - 111 mmol/L 107   CO2 22 - 32 mmol/L 24   Calcium 8.9 - 10.3 mg/dL 9.2        Component Value Date/Time   WBC 7.6 04/27/2022 0938   RBC 4.21 (L) 05/25/2022 1007   RBC 4.32 04/27/2022 0938   HGB 9.2 (L) 04/27/2022 0938   HCT 32.7 (L) 04/27/2022 0938   PLT 320 04/27/2022 0938   MCV 75.7 (L) 04/27/2022 0938   MCH 21.3 (L) 04/27/2022 0938   MCHC 28.1 (L) 04/27/2022 0938   RDW 16.7 (H) 04/27/2022 0938   LYMPHSABS 1.8 04/27/2022 5638  MONOABS 0.6 04/27/2022 0938   EOSABS 0.3 04/27/2022 0938   BASOSABS 0.1 04/27/2022 1610     Parts of this note may have been dictated using voice recognition software. There may be variances in spelling  and vocabulary which are unintentional. Not all errors are proofread. Please notify the There[facility] Parkin if any discrepancies are noted or if the meaning of any statement is not clear.

## 2022-06-09 NOTE — Patient Instructions (Addendum)
Tresiba 65 units once every day Humalog 5 units three times a day 15 min before each meal (take 3 units for small meal, 7 units for a big meal). Do not take if sugar is less than 100 Farxiga 10 mg once a day    Goals of DM therapy:  Morning Fasting blood sugar: 80-140  Blood sugar before meals: 80-140 Bed time blood sugar: 100-150  A1C <7%, limited only by hypoglycemia  1.Diabetes medications and their side effects discussed, including hypoglycemia    2. Check blood glucose:  a) Always check blood sugars before driving. Please see below (under hypoglycemia) on how to manage b) Check a minimum of 3 times/day or more as needed when having symptoms of hypoglycemia.   c) Try to check blood glucose before sleeping/in the middle of the night to ensure that it is remaining stable and not dropping less than 100 d) Check blood glucose more often if sick  3. Diet: a) 3 meals per day schedule b: Restrict carbs to 60-70 grams (4 servings) per meal c) Colorful vegetables - 3 servings a day, and low sugar fruit 2 servings/day Plate control method: 1/4 plate protein, 1/4 starch, 1/2 green, yellow, or red vegetables d) Avoid carbohydrate snacks unless hypoglycemic episode, or increased physical activity  4. Regular exercise as tolerated, preferably 3 or more hours a week  5. Hypoglycemia: a)  Do not drive or operate machinery without first testing blood glucose to assure it is over 90 mg%, or if dizzy, lightheaded, not feeling normal, etc, or  if foot or leg is numb or weak. b)  If blood glucose less than 70, take four 5gm Glucose tabs or 15-30 gm Glucose gel.  Repeat every 15 min as needed until blood sugar is >100 mg/dl. If hypoglycemia persists then call 911.   6. Sick day management: a) Check blood glucose more often b) Continue usual therapy if blood sugars are elevated.   7. Contact the doctor immediately if blood glucose is frequently <60 mg/dl, or an episode of severe hypoglycemia  occurs (where someone had to give you glucose/  glucagon or if you passed out from a low blood glucose), or if blood glucose is persistently >350 mg/dl, for further management  8. A change in level of physical activity or exercise and a change in diet may also affect your blood sugar. Check blood sugars more often and call if needed.  Instructions: 1. Bring glucose meter, blood glucose records on every visit for review 2. Continue to follow up with primary care physician and other providers for medical care 3. Yearly eye  and foot exam 4. Please get blood work done prior to the next appointment

## 2022-06-10 ENCOUNTER — Inpatient Hospital Stay: Payer: Medicare Other

## 2022-06-10 ENCOUNTER — Inpatient Hospital Stay: Payer: Medicare Other | Attending: Oncology | Admitting: Genetic Counselor

## 2022-06-10 DIAGNOSIS — Z808 Family history of malignant neoplasm of other organs or systems: Secondary | ICD-10-CM

## 2022-06-10 DIAGNOSIS — Z803 Family history of malignant neoplasm of breast: Secondary | ICD-10-CM | POA: Diagnosis not present

## 2022-06-10 DIAGNOSIS — C4360 Malignant melanoma of unspecified upper limb, including shoulder: Secondary | ICD-10-CM | POA: Diagnosis not present

## 2022-06-10 DIAGNOSIS — Z800 Family history of malignant neoplasm of digestive organs: Secondary | ICD-10-CM

## 2022-06-14 ENCOUNTER — Encounter: Payer: Self-pay | Admitting: Genetic Counselor

## 2022-06-16 DIAGNOSIS — E119 Type 2 diabetes mellitus without complications: Secondary | ICD-10-CM | POA: Diagnosis not present

## 2022-06-21 ENCOUNTER — Encounter: Payer: Self-pay | Admitting: "Endocrinology

## 2022-06-24 DIAGNOSIS — H401134 Primary open-angle glaucoma, bilateral, indeterminate stage: Secondary | ICD-10-CM | POA: Diagnosis not present

## 2022-07-16 DIAGNOSIS — E119 Type 2 diabetes mellitus without complications: Secondary | ICD-10-CM | POA: Diagnosis not present

## 2022-07-27 DIAGNOSIS — R1310 Dysphagia, unspecified: Secondary | ICD-10-CM | POA: Diagnosis not present

## 2022-07-27 DIAGNOSIS — D510 Vitamin B12 deficiency anemia due to intrinsic factor deficiency: Secondary | ICD-10-CM | POA: Diagnosis not present

## 2022-07-27 DIAGNOSIS — D649 Anemia, unspecified: Secondary | ICD-10-CM | POA: Diagnosis not present

## 2022-08-02 DIAGNOSIS — E785 Hyperlipidemia, unspecified: Secondary | ICD-10-CM | POA: Diagnosis not present

## 2022-08-02 DIAGNOSIS — E1169 Type 2 diabetes mellitus with other specified complication: Secondary | ICD-10-CM | POA: Diagnosis not present

## 2022-08-02 DIAGNOSIS — Z794 Long term (current) use of insulin: Secondary | ICD-10-CM | POA: Diagnosis not present

## 2022-08-02 DIAGNOSIS — E119 Type 2 diabetes mellitus without complications: Secondary | ICD-10-CM | POA: Diagnosis not present

## 2022-08-02 DIAGNOSIS — I10 Essential (primary) hypertension: Secondary | ICD-10-CM | POA: Diagnosis not present

## 2022-08-02 DIAGNOSIS — Z8673 Personal history of transient ischemic attack (TIA), and cerebral infarction without residual deficits: Secondary | ICD-10-CM | POA: Diagnosis not present

## 2022-08-02 DIAGNOSIS — Z6824 Body mass index (BMI) 24.0-24.9, adult: Secondary | ICD-10-CM | POA: Diagnosis not present

## 2022-08-09 ENCOUNTER — Encounter: Payer: Self-pay | Admitting: "Endocrinology

## 2022-08-09 ENCOUNTER — Ambulatory Visit: Payer: Medicare Other | Admitting: "Endocrinology

## 2022-08-09 VITALS — BP 135/70 | HR 84 | Ht 68.0 in | Wt 167.8 lb

## 2022-08-09 DIAGNOSIS — Z7984 Long term (current) use of oral hypoglycemic drugs: Secondary | ICD-10-CM

## 2022-08-09 DIAGNOSIS — Z794 Long term (current) use of insulin: Secondary | ICD-10-CM

## 2022-08-09 DIAGNOSIS — E7800 Pure hypercholesterolemia, unspecified: Secondary | ICD-10-CM | POA: Diagnosis not present

## 2022-08-09 DIAGNOSIS — E1165 Type 2 diabetes mellitus with hyperglycemia: Secondary | ICD-10-CM | POA: Diagnosis not present

## 2022-08-09 MED ORDER — TRESIBA FLEXTOUCH 200 UNIT/ML ~~LOC~~ SOPN: 72.0000 [IU] | PEN_INJECTOR | Freq: Every day | SUBCUTANEOUS | 1 refills | Status: DC

## 2022-08-09 NOTE — Patient Instructions (Addendum)
Tresiba 72 units once every morning Humalog 5 units three times a day 15 min before meals Continue farxiga 10 mg every day  _____________   Goals of DM therapy:  Morning Fasting blood sugar: 80-140  Blood sugar before meals: 80-140 Bed time blood sugar: 100-150  A1C <7%, limited only by hypoglycemia  1.Diabetes medications and their side effects discussed, including hypoglycemia    2. Check blood glucose:  a) Always check blood sugars before driving. Please see below (under hypoglycemia) on how to manage b) Check a minimum of 3 times/day or more as needed when having symptoms of hypoglycemia.   c) Try to check blood glucose before sleeping/in the middle of the night to ensure that it is remaining stable and not dropping less than 100 d) Check blood glucose more often if sick  3. Diet: a) 3 meals per day schedule b: Restrict carbs to 60-70 grams (4 servings) per meal c) Colorful vegetables - 3 servings a day, and low sugar fruit 2 servings/day Plate control method: 1/4 plate protein, 1/4 starch, 1/2 green, yellow, or red vegetables d) Avoid carbohydrate snacks unless hypoglycemic episode, or increased physical activity  4. Regular exercise as tolerated, preferably 3 or more hours a week  5. Hypoglycemia: a)  Do not drive or operate machinery without first testing blood glucose to assure it is over 90 mg%, or if dizzy, lightheaded, not feeling normal, etc, or  if foot or leg is numb or weak. b)  If blood glucose less than 70, take four 5gm Glucose tabs or 15-30 gm Glucose gel.  Repeat every 15 min as needed until blood sugar is >100 mg/dl. If hypoglycemia persists then call 911.   6. Sick day management: a) Check blood glucose more often b) Continue usual therapy if blood sugars are elevated.   7. Contact the doctor immediately if blood glucose is frequently <60 mg/dl, or an episode of severe hypoglycemia occurs (where someone had to give you glucose/  glucagon or if you passed  out from a low blood glucose), or if blood glucose is persistently >350 mg/dl, for further management  8. A change in level of physical activity or exercise and a change in diet may also affect your blood sugar. Check blood sugars more often and call if needed.  Instructions: 1. Bring glucose meter, blood glucose records on every visit for review 2. Continue to follow up with primary care physician and other providers for medical care 3. Yearly eye  and foot exam 4. Please get blood work done prior to the next appointment

## 2022-08-09 NOTE — Progress Notes (Signed)
Outpatient Endocrinology Note Bobby Brandon, MD  08/09/22   Bobby Downs 06/27/1955 [id number]  Referring Provider: Paulina Fusi, MD Primary Care Provider: Paulina Fusi, MD Reason for consultation: Subjective   Assessment & Plan  Diagnoses and all orders for this visit:  Uncontrolled type 2 diabetes mellitus with hyperglycemia (HCC)  Long-term insulin use (HCC)  Long term (current) use of oral hypoglycemic drugs  Pure hypercholesterolemia  Other orders -     TRESIBA FLEXTOUCH 200 UNIT/ML FlexTouch Pen; Inject 72 Units into the skin daily at 6 (six) AM.    Diabetes complicated by neuropathy  Hba1c goal less than 7.0, current Hba1c is 8.9. Will recommend for the following change of medications to: Tresiba 72 units once every morning Humalog 5 units three times a day 15 min before 2 meal Do not take if sugar is less than 100 Farxiga 10 mg once a day  No known contraindications to any of above medications  Hyperlipidemia -Last LDL near goal: 83 -on pravastatin 80 mg QD -Follow low fat diet and exercise   -Blood pressure goal <140/90 - Microalbumin/creatinine not done, ordered it -On lisinopril 10 mg qd -diet changes including salt restriction -limit eating outside -counseled BP targets per standards of diabetes care -Uncontrolled blood pressure can lead to retinopathy, nephropathy and cardiovascular and atherosclerotic heart disease  Reviewed and counseled on: -A1C target -Blood sugar targets -Complications of uncontrolled diabetes  -Checking blood sugar before meals and bedtime and bring log next visit -All medications with mechanism of action and side effects -Hypoglycemia management: rule of 15's, Glucagon Emergency Kit and medical alert ID -low-carb low-fat plate-method diet -At least 20 minutes of physical activity per day -Annual dilated retinal eye exam and foot exam -compliance and follow up needs -follow up as scheduled or  earlier if problem gets worse  Call if blood sugar is less than 70 or consistently above 250    Take a 15 gm snack of carbohydrate at bedtime before you go to sleep if your blood sugar is less than 100.    If you are going to fast after midnight for a test or procedure, ask your physician for instructions on how to reduce/decrease your insulin dose.    Call if blood sugar is less than 70 or consistently above 250  -Treating a low sugar by rule of 15  (15 gms of sugar every 15 min until sugar is more than 70) If you feel your sugar is low, test your sugar to be sure If your sugar is low (less than 70), then take 15 grams of a fast acting Carbohydrate (3-4 glucose tablets or glucose gel or 4 ounces of juice or regular soda) Recheck your sugar 15 min after treating low to make sure it is more than 70 If sugar is still less than 70, treat again with 15 grams of carbohydrate          Don't drive the hour of hypoglycemia  If unconscious/unable to eat or drink by mouth, use glucagon injection or nasal spray baqsimi and call 911. Can repeat again in 15 min if still unconscious.  Return in about 3 months (around 11/09/2022).   I have reviewed current medications, nurse's notes, allergies, vital signs, past medical and surgical history, family medical history, and social history for this encounter. Counseled patient on symptoms, examination findings, lab findings, imaging results, treatment decisions and monitoring and prognosis. The patient understood the recommendations and agrees with the treatment plan.  All questions regarding treatment plan were fully answered.  Bobby Coulee Dam, MD  08/09/22   History of Present Illness Bobby Downs is a 67 y.o. year old male who presents for evaluation of Type 2 diabetes mellitus.  Bobby Downs was first diagnosed around 2007.   Diabetes education +  Home diabetes regimen: Tresiba 65 units qam Humalog 6 units before dinner 15 min  before Farxiga 10 mg qd  COMPLICATIONS + Stroke -  retinopathy, last eye exam 2023 + neuropathy -  nephropathy  Labs reviewed  06/22/21 GFR 97 Cr 0.9 AST 12 ALT 18 Tg 85 HDL 51 VLDL 16 LDL 71    BLOOD SUGAR DATA  CGM interpretation: At today's visit, we reviewed her CGM downloads. The full report is scanned in the media. Reviewing the CGM trends, BG are elevated across the day with some lows after dinner.  Physical Exam  BP 135/70   Pulse 84   Ht 5\' 8"  (1.727 m)   Wt 167 lb 12.8 oz (76.1 kg)   SpO2 99%   BMI 25.51 kg/m    Constitutional: well developed, well nourished Head: normocephalic, atraumatic Eyes: sclera anicteric, no redness Neck: supple Lungs: normal respiratory effort Neurology: alert and oriented Skin: dry, no appreciable rashes Musculoskeletal: no appreciable defects Psychiatric: normal mood and affect    Current Medications Patient's Medications  New Prescriptions   No medications on file  Previous Medications   ASPIRIN EC 81 MG TABLET    Take 81 mg by mouth daily. Swallow whole.   CLOPIDOGREL (PLAVIX) 75 MG TABLET    Take 75 mg by mouth daily.   FARXIGA 10 MG TABS TABLET    Take 10 mg by mouth daily.   FERROUS FUMARATE (IRON) 18 MG TBCR    Take 18 mg by mouth daily.   INSULIN LISPRO (HUMALOG KWIKPEN) 100 UNIT/ML KWIKPEN    Inject 5 Units into the skin 3 (three) times daily.   LATANOPROST (XALATAN) 0.005 % OPHTHALMIC SOLUTION    1 drop at bedtime.   LISINOPRIL (ZESTRIL) 10 MG TABLET    Take 1 tablet by mouth daily.   MULTIPLE VITAMIN (MULTIVITAMIN) CAPSULE    Take 1 capsule by mouth daily.   PRAVASTATIN (PRAVACHOL) 80 MG TABLET    Take 80 mg by mouth daily.   TIMOLOL (TIMOPTIC) 0.5 % OPHTHALMIC SOLUTION    1 drop daily.  Modified Medications   Modified Medication Previous Medication   TRESIBA FLEXTOUCH 200 UNIT/ML FLEXTOUCH PEN TRESIBA FLEXTOUCH 200 UNIT/ML FlexTouch Pen      Inject 72 Units into the skin daily at 6 (six) AM.    Inject 70  Units into the skin daily at 6 (six) AM. Inject 7 units daily  Discontinued Medications   No medications on file    Allergies No Known Allergies  Past Medical History Past Medical History:  Diagnosis Date   Diabetes mellitus without complication (HCC)    Diabetic retinopathy (HCC)    Glaucoma    History of CVA (cerebrovascular accident)    History of melanoma    History of tobacco use    Hypertension    Insulin dependent type 2 diabetes mellitus (HCC)     Past Surgical History Past Surgical History:  Procedure Laterality Date   DEEP NECK LYMPH NODE BIOPSY / EXCISION      Family History family history includes Brain cancer (age of onset: 48) in his brother; Breast cancer in his mother; Breast cancer (age of onset: 37) in  his sister; Cancer in his maternal grandfather; Colon cancer (age of onset: 8) in his mother; Diabetes type II in his mother; Hearing loss in his sister; Hyperlipidemia in his mother; Hypertension in his mother; Seizures in his brother.  Social History Social History   Socioeconomic History   Marital status: Married    Spouse name: Not on file   Number of children: Not on file   Years of education: Not on file   Highest education level: Not on file  Occupational History   Not on file  Tobacco Use   Smoking status: Every Day    Current packs/day: 1.00    Average packs/day: 1 pack/day for 40.0 years (40.0 ttl pk-yrs)    Types: Cigarettes   Smokeless tobacco: Never  Vaping Use   Vaping status: Never Used  Substance and Sexual Activity   Alcohol use: Not Currently   Drug use: Never   Sexual activity: Not on file  Other Topics Concern   Not on file  Social History Narrative   Not on file   Social Determinants of Health   Financial Resource Strain: Not on file  Food Insecurity: No Food Insecurity (04/27/2022)   Hunger Vital Sign    Worried About Running Out of Food in the Last Year: Never true    Ran Out of Food in the Last Year: Never true   Transportation Needs: No Transportation Needs (04/27/2022)   PRAPARE - Administrator, Civil Service (Medical): No    Lack of Transportation (Non-Medical): No  Physical Activity: Not on file  Stress: Not on file  Social Connections: Not on file  Intimate Partner Violence: Not At Risk (04/27/2022)   Humiliation, Afraid, Rape, and Kick questionnaire    Fear of Current or Ex-Partner: No    Emotionally Abused: No    Physically Abused: No    Sexually Abused: No    Lab Results  Component Value Date   HGBA1C 8.5 (A) 06/09/2022   No results found for: "CHOL" No results found for: "HDL" No results found for: "LDLCALC" No results found for: "TRIG" No results found for: "CHOLHDL" Lab Results  Component Value Date   CREATININE 0.94 04/27/2022   No results found for: "GFR" No results found for: "MICROALBUR", "MALB24HUR"    Component Value Date/Time   NA 140 04/27/2022 0938   K 4.2 04/27/2022 0938   CL 107 04/27/2022 0938   CO2 24 04/27/2022 0938   GLUCOSE 173 (H) 04/27/2022 0938   BUN 19 04/27/2022 0938   CREATININE 0.94 04/27/2022 0938   CALCIUM 9.2 04/27/2022 0938   PROT 7.3 04/27/2022 0938   ALBUMIN 4.1 04/27/2022 0938   AST 24 04/27/2022 0938   ALT 25 04/27/2022 0938   ALKPHOS 95 04/27/2022 0938   BILITOT 0.6 04/27/2022 0938   GFRNONAA >60 04/27/2022 0938      Latest Ref Rng & Units 04/27/2022    9:38 AM  BMP  Glucose 70 - 99 mg/dL 188   BUN 8 - 23 mg/dL 19   Creatinine 4.16 - 1.24 mg/dL 6.06   Sodium 301 - 601 mmol/L 140   Potassium 3.5 - 5.1 mmol/L 4.2   Chloride 98 - 111 mmol/L 107   CO2 22 - 32 mmol/L 24   Calcium 8.9 - 10.3 mg/dL 9.2        Component Value Date/Time   WBC 7.6 04/27/2022 0938   RBC 4.21 (L) 05/25/2022 1007   RBC 4.32 04/27/2022 0938   HGB  9.2 (L) 04/27/2022 0938   HCT 32.7 (L) 04/27/2022 0938   PLT 320 04/27/2022 0938   MCV 75.7 (L) 04/27/2022 0938   MCH 21.3 (L) 04/27/2022 0938   MCHC 28.1 (L) 04/27/2022 0938   RDW 16.7  (H) 04/27/2022 0938   LYMPHSABS 1.8 04/27/2022 0938   MONOABS 0.6 04/27/2022 0938   EOSABS 0.3 04/27/2022 0938   BASOSABS 0.1 04/27/2022 0938     Parts of this note may have been dictated using voice recognition software. There may be variances in spelling and vocabulary which are unintentional. Not all errors are proofread. Please notify the There[facility] Parkin if any discrepancies are noted or if the meaning of any statement is not clear.

## 2022-08-16 DIAGNOSIS — E119 Type 2 diabetes mellitus without complications: Secondary | ICD-10-CM | POA: Diagnosis not present

## 2022-08-30 DIAGNOSIS — Z800 Family history of malignant neoplasm of digestive organs: Secondary | ICD-10-CM | POA: Diagnosis not present

## 2022-08-30 DIAGNOSIS — I10 Essential (primary) hypertension: Secondary | ICD-10-CM | POA: Diagnosis not present

## 2022-08-30 DIAGNOSIS — R1310 Dysphagia, unspecified: Secondary | ICD-10-CM | POA: Diagnosis not present

## 2022-08-30 DIAGNOSIS — E119 Type 2 diabetes mellitus without complications: Secondary | ICD-10-CM | POA: Diagnosis not present

## 2022-08-30 DIAGNOSIS — K317 Polyp of stomach and duodenum: Secondary | ICD-10-CM | POA: Diagnosis not present

## 2022-08-30 DIAGNOSIS — D124 Benign neoplasm of descending colon: Secondary | ICD-10-CM | POA: Diagnosis not present

## 2022-08-30 DIAGNOSIS — K222 Esophageal obstruction: Secondary | ICD-10-CM | POA: Diagnosis not present

## 2022-08-30 DIAGNOSIS — D126 Benign neoplasm of colon, unspecified: Secondary | ICD-10-CM | POA: Diagnosis not present

## 2022-08-30 DIAGNOSIS — K635 Polyp of colon: Secondary | ICD-10-CM | POA: Diagnosis not present

## 2022-08-30 DIAGNOSIS — Z1211 Encounter for screening for malignant neoplasm of colon: Secondary | ICD-10-CM | POA: Diagnosis not present

## 2022-08-30 DIAGNOSIS — D122 Benign neoplasm of ascending colon: Secondary | ICD-10-CM | POA: Diagnosis not present

## 2022-08-30 DIAGNOSIS — D509 Iron deficiency anemia, unspecified: Secondary | ICD-10-CM | POA: Diagnosis not present

## 2022-08-30 LAB — HM COLONOSCOPY

## 2022-10-03 DIAGNOSIS — D649 Anemia, unspecified: Secondary | ICD-10-CM | POA: Diagnosis not present

## 2022-10-06 DIAGNOSIS — D225 Melanocytic nevi of trunk: Secondary | ICD-10-CM | POA: Diagnosis not present

## 2022-10-06 DIAGNOSIS — D485 Neoplasm of uncertain behavior of skin: Secondary | ICD-10-CM | POA: Diagnosis not present

## 2022-10-06 DIAGNOSIS — L570 Actinic keratosis: Secondary | ICD-10-CM | POA: Diagnosis not present

## 2022-10-06 DIAGNOSIS — L814 Other melanin hyperpigmentation: Secondary | ICD-10-CM | POA: Diagnosis not present

## 2022-10-06 DIAGNOSIS — D2239 Melanocytic nevi of other parts of face: Secondary | ICD-10-CM | POA: Diagnosis not present

## 2022-10-06 DIAGNOSIS — Z85820 Personal history of malignant melanoma of skin: Secondary | ICD-10-CM | POA: Diagnosis not present

## 2022-10-24 DIAGNOSIS — R051 Acute cough: Secondary | ICD-10-CM | POA: Diagnosis not present

## 2022-10-24 DIAGNOSIS — J324 Chronic pansinusitis: Secondary | ICD-10-CM | POA: Diagnosis not present

## 2022-10-24 DIAGNOSIS — R0981 Nasal congestion: Secondary | ICD-10-CM | POA: Diagnosis not present

## 2022-11-07 DIAGNOSIS — Z23 Encounter for immunization: Secondary | ICD-10-CM | POA: Diagnosis not present

## 2022-11-07 DIAGNOSIS — I10 Essential (primary) hypertension: Secondary | ICD-10-CM | POA: Diagnosis not present

## 2022-11-07 DIAGNOSIS — E1169 Type 2 diabetes mellitus with other specified complication: Secondary | ICD-10-CM | POA: Diagnosis not present

## 2022-11-07 DIAGNOSIS — Z85820 Personal history of malignant melanoma of skin: Secondary | ICD-10-CM | POA: Diagnosis not present

## 2022-11-07 DIAGNOSIS — Z8673 Personal history of transient ischemic attack (TIA), and cerebral infarction without residual deficits: Secondary | ICD-10-CM | POA: Diagnosis not present

## 2022-11-07 DIAGNOSIS — Z794 Long term (current) use of insulin: Secondary | ICD-10-CM | POA: Diagnosis not present

## 2022-11-07 DIAGNOSIS — Z6824 Body mass index (BMI) 24.0-24.9, adult: Secondary | ICD-10-CM | POA: Diagnosis not present

## 2022-11-07 DIAGNOSIS — J301 Allergic rhinitis due to pollen: Secondary | ICD-10-CM | POA: Diagnosis not present

## 2022-11-07 DIAGNOSIS — E113299 Type 2 diabetes mellitus with mild nonproliferative diabetic retinopathy without macular edema, unspecified eye: Secondary | ICD-10-CM | POA: Diagnosis not present

## 2022-11-07 DIAGNOSIS — E782 Mixed hyperlipidemia: Secondary | ICD-10-CM | POA: Diagnosis not present

## 2022-11-08 DIAGNOSIS — Z0000 Encounter for general adult medical examination without abnormal findings: Secondary | ICD-10-CM | POA: Diagnosis not present

## 2022-11-08 DIAGNOSIS — Z9181 History of falling: Secondary | ICD-10-CM | POA: Diagnosis not present

## 2022-11-09 ENCOUNTER — Ambulatory Visit: Payer: Medicare Other | Admitting: "Endocrinology

## 2022-11-14 ENCOUNTER — Ambulatory Visit: Payer: Medicare Other | Admitting: "Endocrinology

## 2022-11-14 ENCOUNTER — Encounter: Payer: Self-pay | Admitting: "Endocrinology

## 2022-11-14 VITALS — BP 120/76 | HR 86 | Ht 68.0 in | Wt 164.2 lb

## 2022-11-14 DIAGNOSIS — E1165 Type 2 diabetes mellitus with hyperglycemia: Secondary | ICD-10-CM

## 2022-11-14 DIAGNOSIS — E7800 Pure hypercholesterolemia, unspecified: Secondary | ICD-10-CM | POA: Diagnosis not present

## 2022-11-14 DIAGNOSIS — Z7984 Long term (current) use of oral hypoglycemic drugs: Secondary | ICD-10-CM

## 2022-11-14 DIAGNOSIS — Z794 Long term (current) use of insulin: Secondary | ICD-10-CM | POA: Diagnosis not present

## 2022-11-14 LAB — POCT GLYCOSYLATED HEMOGLOBIN (HGB A1C): Hemoglobin A1C: 8 % — AB (ref 4.0–5.6)

## 2022-11-14 MED ORDER — TRESIBA FLEXTOUCH 200 UNIT/ML ~~LOC~~ SOPN
75.0000 [IU] | PEN_INJECTOR | Freq: Every day | SUBCUTANEOUS | 1 refills | Status: DC
Start: 2022-11-14 — End: 2023-01-02

## 2022-11-14 NOTE — Progress Notes (Signed)
Outpatient Endocrinology Note Bobby Urie, MD  11/14/22   Bobby Downs June 04, 1955 [id number]  Referring Provider: Paulina Fusi, MD Primary Care Provider: Paulina Fusi, MD Reason for consultation: Subjective   Assessment & Plan  Diagnoses and all orders for this visit:  Uncontrolled type 2 diabetes mellitus with hyperglycemia (HCC) -     POCT glycosylated hemoglobin (Hb A1C) -     insulin degludec (TRESIBA FLEXTOUCH) 200 UNIT/ML FlexTouch Pen; Inject 76 Units into the skin daily at 6 (six) AM.  Long-term insulin use (HCC)  Long term (current) use of oral hypoglycemic drugs  Pure hypercholesterolemia     Diabetes complicated by neuropathy  Hba1c goal less than 7.0, current Hba1c is 8.2. Will recommend for the following change of medications to: Tresiba 75-76 units once every morning Humalog 6 units three times a day 15 min before meals (only 5 units before lunch) If blood sugar is more than 200 when you are skipping meal, take 3 units of Humalog   Do not take if sugar is less than 100 Farxiga 10 mg once a day  No known contraindications to any of above medications  Hyperlipidemia -Last LDL near goal: 83 -on pravastatin 80 mg QD -Follow low fat diet and exercise   -Blood pressure goal <140/90 - Microalbumin/creatinine not done, ordered it -On lisinopril 10 mg qd -diet changes including salt restriction -limit eating outside -counseled BP targets per standards of diabetes care -Uncontrolled blood pressure can lead to retinopathy, nephropathy and cardiovascular and atherosclerotic heart disease  Reviewed and counseled on: -A1C target -Blood sugar targets -Complications of uncontrolled diabetes  -Checking blood sugar before meals and bedtime and bring log next visit -All medications with mechanism of action and side effects -Hypoglycemia management: rule of 15's, Glucagon Emergency Kit and medical alert ID -low-carb low-fat plate-method  diet -At least 20 minutes of physical activity per day -Annual dilated retinal eye exam and foot exam -compliance and follow up needs -follow up as scheduled or earlier if problem gets worse  Call if blood sugar is less than 70 or consistently above 250    Take a 15 gm snack of carbohydrate at bedtime before you go to sleep if your blood sugar is less than 100.    If you are going to fast after midnight for a test or procedure, ask your physician for instructions on how to reduce/decrease your insulin dose.    Call if blood sugar is less than 70 or consistently above 250  -Treating a low sugar by rule of 15  (15 gms of sugar every 15 min until sugar is more than 70) If you feel your sugar is low, test your sugar to be sure If your sugar is low (less than 70), then take 15 grams of a fast acting Carbohydrate (3-4 glucose tablets or glucose gel or 4 ounces of juice or regular soda) Recheck your sugar 15 min after treating low to make sure it is more than 70 If sugar is still less than 70, treat again with 15 grams of carbohydrate          Don't drive the hour of hypoglycemia  If unconscious/unable to eat or drink by mouth, use glucagon injection or nasal spray baqsimi and call 911. Can repeat again in 15 min if still unconscious.  Return in about 4 weeks (around 12/12/2022).   I have reviewed current medications, nurse's notes, allergies, vital signs, past medical and surgical history, family medical history,  and social history for this encounter. Counseled patient on symptoms, examination findings, lab findings, imaging results, treatment decisions and monitoring and prognosis. The patient understood the recommendations and agrees with the treatment plan. All questions regarding treatment plan were fully answered.  Bobby Hoven, MD  11/14/22   History of Present Illness Bobby Downs is a 67 y.o. year old male who presents for evaluation of Type 2 diabetes mellitus.  Ihab Woltz  Ferran was first diagnosed around 2007.   Diabetes education +  Home diabetes regimen: Tresiba 70 units qam Humalog 6 units before dinner 15 min before Farxiga 10 mg qd  COMPLICATIONS + Stroke -  retinopathy, last eye exam 2023 + neuropathy -  nephropathy  Labs reviewed  06/22/21 GFR 97 Cr 0.9 AST 12 ALT 18 Tg 85 HDL 51 VLDL 16 LDL 71  BLOOD SUGAR DATA  CGM interpretation: At today's visit, we reviewed her CGM downloads. The full report is scanned in the media. Reviewing the CGM trends, BG are elevated across the day with some lows after dinner.  Physical Exam  BP 120/76   Pulse 86   Ht 5\' 8"  (1.727 m)   Wt 164 lb 3.2 oz (74.5 kg)   SpO2 97%   BMI 24.97 kg/m    Constitutional: well developed, well nourished Head: normocephalic, atraumatic Eyes: sclera anicteric, no redness Neck: supple Lungs: normal respiratory effort Neurology: alert and oriented Skin: dry, no appreciable rashes Musculoskeletal: no appreciable defects Psychiatric: normal mood and affect    Current Medications Patient's Medications  New Prescriptions   No medications on file  Previous Medications   ASPIRIN EC 81 MG TABLET    Take 81 mg by mouth daily. Swallow whole.   CLOPIDOGREL (PLAVIX) 75 MG TABLET    Take 75 mg by mouth daily.   FARXIGA 10 MG TABS TABLET    Take 10 mg by mouth daily.   FERROUS FUMARATE (IRON) 18 MG TBCR    Take 18 mg by mouth daily.   INSULIN LISPRO (HUMALOG KWIKPEN) 100 UNIT/ML KWIKPEN    Inject 5 Units into the skin 3 (three) times daily.   LATANOPROST (XALATAN) 0.005 % OPHTHALMIC SOLUTION    1 drop at bedtime.   LISINOPRIL (ZESTRIL) 10 MG TABLET    Take 1 tablet by mouth daily.   MULTIPLE VITAMIN (MULTIVITAMIN) CAPSULE    Take 1 capsule by mouth daily.   PRAVASTATIN (PRAVACHOL) 80 MG TABLET    Take 80 mg by mouth daily.   TIMOLOL (TIMOPTIC) 0.5 % OPHTHALMIC SOLUTION    1 drop daily.  Modified Medications   Modified Medication Previous Medication   INSULIN  DEGLUDEC (TRESIBA FLEXTOUCH) 200 UNIT/ML FLEXTOUCH PEN TRESIBA FLEXTOUCH 200 UNIT/ML FlexTouch Pen      Inject 76 Units into the skin daily at 6 (six) AM.    Inject 72 Units into the skin daily at 6 (six) AM.  Discontinued Medications   No medications on file    Allergies No Known Allergies  Past Medical History Past Medical History:  Diagnosis Date   Diabetes mellitus without complication (HCC)    Diabetic retinopathy (HCC)    Glaucoma    History of CVA (cerebrovascular accident)    History of melanoma    History of tobacco use    Hypertension    Insulin dependent type 2 diabetes mellitus (HCC)     Past Surgical History Past Surgical History:  Procedure Laterality Date   DEEP NECK LYMPH NODE BIOPSY / EXCISION  Family History family history includes Brain cancer (age of onset: 64) in his brother; Breast cancer in his mother; Breast cancer (age of onset: 70) in his sister; Cancer in his maternal grandfather; Colon cancer (age of onset: 37) in his mother; Diabetes type II in his mother; Hearing loss in his sister; Hyperlipidemia in his mother; Hypertension in his mother; Seizures in his brother.  Social History Social History   Socioeconomic History   Marital status: Married    Spouse name: Not on file   Number of children: Not on file   Years of education: Not on file   Highest education level: Not on file  Occupational History   Not on file  Tobacco Use   Smoking status: Every Day    Current packs/day: 1.00    Average packs/day: 1 pack/day for 40.0 years (40.0 ttl pk-yrs)    Types: Cigarettes   Smokeless tobacco: Never  Vaping Use   Vaping status: Never Used  Substance and Sexual Activity   Alcohol use: Not Currently   Drug use: Never   Sexual activity: Not on file  Other Topics Concern   Not on file  Social History Narrative   Not on file   Social Determinants of Health   Financial Resource Strain: Not on file  Food Insecurity: No Food Insecurity  (04/27/2022)   Hunger Vital Sign    Worried About Running Out of Food in the Last Year: Never true    Ran Out of Food in the Last Year: Never true  Transportation Needs: No Transportation Needs (04/27/2022)   PRAPARE - Administrator, Civil Service (Medical): No    Lack of Transportation (Non-Medical): No  Physical Activity: Not on file  Stress: Not on file  Social Connections: Not on file  Intimate Partner Violence: Not At Risk (04/27/2022)   Humiliation, Afraid, Rape, and Kick questionnaire    Fear of Current or Ex-Partner: No    Emotionally Abused: No    Physically Abused: No    Sexually Abused: No    Lab Results  Component Value Date   HGBA1C 8.0 (A) 11/14/2022   No results found for: "CHOL" No results found for: "HDL" No results found for: "LDLCALC" No results found for: "TRIG" No results found for: "CHOLHDL" Lab Results  Component Value Date   CREATININE 0.94 04/27/2022   No results found for: "GFR" No results found for: "MICROALBUR", "MALB24HUR"    Component Value Date/Time   NA 140 04/27/2022 0938   K 4.2 04/27/2022 0938   CL 107 04/27/2022 0938   CO2 24 04/27/2022 0938   GLUCOSE 173 (H) 04/27/2022 0938   BUN 19 04/27/2022 0938   CREATININE 0.94 04/27/2022 0938   CALCIUM 9.2 04/27/2022 0938   PROT 7.3 04/27/2022 0938   ALBUMIN 4.1 04/27/2022 0938   AST 24 04/27/2022 0938   ALT 25 04/27/2022 0938   ALKPHOS 95 04/27/2022 0938   BILITOT 0.6 04/27/2022 0938   GFRNONAA >60 04/27/2022 0938      Latest Ref Rng & Units 04/27/2022    9:38 AM  BMP  Glucose 70 - 99 mg/dL 161   BUN 8 - 23 mg/dL 19   Creatinine 0.96 - 1.24 mg/dL 0.45   Sodium 409 - 811 mmol/L 140   Potassium 3.5 - 5.1 mmol/L 4.2   Chloride 98 - 111 mmol/L 107   CO2 22 - 32 mmol/L 24   Calcium 8.9 - 10.3 mg/dL 9.2  Component Value Date/Time   WBC 7.6 04/27/2022 0938   RBC 4.21 (L) 05/25/2022 1007   RBC 4.32 04/27/2022 0938   HGB 9.2 (L) 04/27/2022 0938   HCT 32.7 (L)  04/27/2022 0938   PLT 320 04/27/2022 0938   MCV 75.7 (L) 04/27/2022 0938   MCH 21.3 (L) 04/27/2022 0938   MCHC 28.1 (L) 04/27/2022 0938   RDW 16.7 (H) 04/27/2022 0938   LYMPHSABS 1.8 04/27/2022 0938   MONOABS 0.6 04/27/2022 0938   EOSABS 0.3 04/27/2022 0938   BASOSABS 0.1 04/27/2022 0938     Parts of this note may have been dictated using voice recognition software. There may be variances in spelling and vocabulary which are unintentional. Not all errors are proofread. Please notify the [facility] Parkin if any discrepancies are noted or if the meaning of any statement is not clear.

## 2022-11-14 NOTE — Patient Instructions (Signed)
Will recommend for the following change of medications to: Tresiba 75-76 units once every morning Humalog 6 units three times a day 15 min before meals (only 5 units before lunch) If blood sugar is more than 200 when you are skipping meal, take 3 units of Humalog   Do not take if sugar is less than 100 Farxiga 10 mg once a day

## 2022-11-15 ENCOUNTER — Encounter: Payer: Self-pay | Admitting: "Endocrinology

## 2022-11-21 DIAGNOSIS — R1310 Dysphagia, unspecified: Secondary | ICD-10-CM | POA: Diagnosis not present

## 2022-11-21 DIAGNOSIS — D649 Anemia, unspecified: Secondary | ICD-10-CM | POA: Diagnosis not present

## 2022-11-24 DIAGNOSIS — E119 Type 2 diabetes mellitus without complications: Secondary | ICD-10-CM | POA: Diagnosis not present

## 2022-12-14 ENCOUNTER — Encounter: Payer: Self-pay | Admitting: "Endocrinology

## 2022-12-14 ENCOUNTER — Ambulatory Visit: Payer: Medicare Other | Admitting: "Endocrinology

## 2022-12-14 ENCOUNTER — Telehealth: Payer: Self-pay

## 2022-12-14 VITALS — BP 100/60 | HR 88 | Ht 68.0 in | Wt 163.0 lb

## 2022-12-14 DIAGNOSIS — Z794 Long term (current) use of insulin: Secondary | ICD-10-CM

## 2022-12-14 DIAGNOSIS — E7800 Pure hypercholesterolemia, unspecified: Secondary | ICD-10-CM | POA: Diagnosis not present

## 2022-12-14 DIAGNOSIS — Z7984 Long term (current) use of oral hypoglycemic drugs: Secondary | ICD-10-CM

## 2022-12-14 DIAGNOSIS — E1165 Type 2 diabetes mellitus with hyperglycemia: Secondary | ICD-10-CM | POA: Diagnosis not present

## 2022-12-14 MED ORDER — FREESTYLE LIBRE 3 PLUS SENSOR MISC: 3 refills | Status: DC

## 2022-12-14 MED ORDER — BAQSIMI ONE PACK 3 MG/DOSE NA POWD: 1.0000 | NASAL | 3 refills | Status: AC | PRN

## 2022-12-14 NOTE — Patient Instructions (Signed)
Tresiba 66 units once every morning Humalog 5 units 15 min before meals Do not take humalog if sugar is less than 100 Farxiga 10 mg once a day

## 2022-12-14 NOTE — Progress Notes (Signed)
Outpatient Endocrinology Note Bobby Downs , MD  12/14/22   Bobby Downs 01/30/1955 [id number]  Referring Provider: Paulina Fusi, MD Primary Care Provider: Paulina Fusi, MD Reason for consultation: Subjective   Assessment & Plan  Diagnoses and all orders for this visit:  Uncontrolled type 2 diabetes mellitus with hyperglycemia (HCC) -     Ambulatory referral to diabetic education  Long-term insulin use (HCC)  Long term (current) use of oral hypoglycemic drugs  Pure hypercholesterolemia  Other orders -     Discontinue: Continuous Glucose Sensor (FREESTYLE LIBRE 3 PLUS SENSOR) MISC; Change sensor every 15 days. -     Glucagon (BAQSIMI ONE PACK) 3 MG/DOSE POWD; Place 1 Device into the nose as needed (Low blood sugar with impaired consciousness).    Diabetes complicated by neuropathy  Hba1c goal less than 7.0, current Hba1c is 8.2. Will recommend for the following change of medications to: Tresiba 66 units once every morning Humalog 5 units before 15 min before meals (non-complaint)  If blood sugar is more than 200 when you are skipping meal, take 3 units of Humalog   Do not take if sugar is less than 100 Farxiga 10 mg once a day  Not interested di insulin pump No known contraindications to any of above medications Ordered Baqsimi on 12/14/22 and explained its use    Hyperlipidemia -Last LDL near goal: 83 -on pravastatin 80 mg QD -Follow low fat diet and exercise   -Blood pressure goal <140/90 - Microalbumin/creatinine not done, ordered it -On lisinopril 10 mg qd -diet changes including salt restriction -limit eating outside -counseled BP targets per standards of diabetes care -Uncontrolled blood pressure can lead to retinopathy, nephropathy and cardiovascular and atherosclerotic heart disease  Reviewed and counseled on: -A1C target -Blood sugar targets -Complications of uncontrolled diabetes  -Checking blood sugar before meals and  bedtime and bring log next visit -All medications with mechanism of action and side effects -Hypoglycemia management: rule of 15's, Glucagon Emergency Kit and medical alert ID -low-carb low-fat plate-method diet -At least 20 minutes of physical activity per day -Annual dilated retinal eye exam and foot exam -compliance and follow up needs -follow up as scheduled or earlier if problem gets worse  Call if blood sugar is less than 70 or consistently above 250    Take a 15 gm snack of carbohydrate at bedtime before you go to sleep if your blood sugar is less than 100.    If you are going to fast after midnight for a test or procedure, ask your physician for instructions on how to reduce/decrease your insulin dose.    Call if blood sugar is less than 70 or consistently above 250  -Treating a low sugar by rule of 15  (15 gms of sugar every 15 min until sugar is more than 70) If you feel your sugar is low, test your sugar to be sure If your sugar is low (less than 70), then take 15 grams of a fast acting Carbohydrate (3-4 glucose tablets or glucose gel or 4 ounces of juice or regular soda) Recheck your sugar 15 min after treating low to make sure it is more than 70 If sugar is still less than 70, treat again with 15 grams of carbohydrate          Don't drive the hour of hypoglycemia  If unconscious/unable to eat or drink by mouth, use glucagon injection or nasal spray baqsimi and call 911. Can repeat again in  15 min if still unconscious.  Return in about 22 days (around 01/05/2023).   I have reviewed current medications, nurse's notes, allergies, vital signs, past medical and surgical history, family medical history, and social history for this encounter. Counseled patient on symptoms, examination findings, lab findings, imaging results, treatment decisions and monitoring and prognosis. The patient understood the recommendations and agrees with the treatment plan. All questions regarding treatment  plan were fully answered.  Bobby Downs , MD  12/14/22   History of Present Illness Bobby Downs is a 67 y.o. year old male who presents for follow up of Type 2 diabetes mellitus.  Vidyut Petrin Arai was first diagnosed around 2007.   Diabetes education +  Home diabetes regimen: Tresiba 72 units qam Humalog 6 units before dinner 15 min before Farxiga 10 mg every day  COMPLICATIONS + Stroke -  retinopathy, last eye exam 2023 + neuropathy -  nephropathy  Labs reviewed  06/22/21 GFR 97 Cr 0.9 AST 12 ALT 18 Tg 85 HDL 51 VLDL 16 LDL 71  BLOOD SUGAR DATA  CGM interpretation: At today's visit, we reviewed her CGM downloads. The full report is scanned in the media. Reviewing the CGM trends, BG are elevated across the day with some lows overnight and in afternoon.  Physical Exam  BP 100/60   Pulse 88   Ht 5\' 8"  (1.727 m)   Wt 163 lb (73.9 kg)   SpO2 98%   BMI 24.78 kg/m    Constitutional: well developed, well nourished Head: normocephalic, atraumatic Eyes: sclera anicteric, no redness Neck: supple Lungs: normal respiratory effort Neurology: alert and oriented Skin: dry, no appreciable rashes Musculoskeletal: no appreciable defects Psychiatric: normal mood and affect    Current Medications Patient's Medications  New Prescriptions   GLUCAGON (BAQSIMI ONE PACK) 3 MG/DOSE POWD    Place 1 Device into the nose as needed (Low blood sugar with impaired consciousness).  Previous Medications   ASPIRIN EC 81 MG TABLET    Take 81 mg by mouth daily. Swallow whole.   CLOPIDOGREL (PLAVIX) 75 MG TABLET    Take 75 mg by mouth daily.   FARXIGA 10 MG TABS TABLET    Take 10 mg by mouth daily.   FERROUS FUMARATE (IRON) 18 MG TBCR    Take 18 mg by mouth daily.   INSULIN DEGLUDEC (TRESIBA FLEXTOUCH) 200 UNIT/ML FLEXTOUCH PEN    Inject 76 Units into the skin daily at 6 (six) AM.   INSULIN LISPRO (HUMALOG KWIKPEN) 100 UNIT/ML KWIKPEN    Inject 5 Units into the skin 3 (three)  times daily.   LATANOPROST (XALATAN) 0.005 % OPHTHALMIC SOLUTION    1 drop at bedtime.   LISINOPRIL (ZESTRIL) 10 MG TABLET    Take 1 tablet by mouth daily.   MULTIPLE VITAMIN (MULTIVITAMIN) CAPSULE    Take 1 capsule by mouth daily.   PRAVASTATIN (PRAVACHOL) 80 MG TABLET    Take 80 mg by mouth daily.   TIMOLOL (TIMOPTIC) 0.5 % OPHTHALMIC SOLUTION    1 drop daily.  Modified Medications   No medications on file  Discontinued Medications   No medications on file    Allergies No Known Allergies  Past Medical History Past Medical History:  Diagnosis Date   Diabetes mellitus without complication (HCC)    Diabetic retinopathy (HCC)    Glaucoma    History of CVA (cerebrovascular accident)    History of melanoma    History of tobacco use    Hypertension  Insulin dependent type 2 diabetes mellitus (HCC)     Past Surgical History Past Surgical History:  Procedure Laterality Date   DEEP NECK LYMPH NODE BIOPSY / EXCISION      Family History family history includes Brain cancer (age of onset: 37) in his brother; Breast cancer in his mother; Breast cancer (age of onset: 76) in his sister; Cancer in his maternal grandfather; Colon cancer (age of onset: 68) in his mother; Diabetes type II in his mother; Hearing loss in his sister; Hyperlipidemia in his mother; Hypertension in his mother; Seizures in his brother.  Social History Social History   Socioeconomic History   Marital status: Married    Spouse name: Not on file   Number of children: Not on file   Years of education: Not on file   Highest education level: Not on file  Occupational History   Not on file  Tobacco Use   Smoking status: Every Day    Current packs/day: 1.00    Average packs/day: 1 pack/day for 40.0 years (40.0 ttl pk-yrs)    Types: Cigarettes   Smokeless tobacco: Never  Vaping Use   Vaping status: Never Used  Substance and Sexual Activity   Alcohol use: Not Currently   Drug use: Never   Sexual activity:  Not on file  Other Topics Concern   Not on file  Social History Narrative   Not on file   Social Determinants of Health   Financial Resource Strain: Not on file  Food Insecurity: No Food Insecurity (04/27/2022)   Hunger Vital Sign    Worried About Running Out of Food in the Last Year: Never true    Ran Out of Food in the Last Year: Never true  Transportation Needs: No Transportation Needs (04/27/2022)   PRAPARE - Administrator, Civil Service (Medical): No    Lack of Transportation (Non-Medical): No  Physical Activity: Not on file  Stress: Not on file  Social Connections: Not on file  Intimate Partner Violence: Not At Risk (04/27/2022)   Humiliation, Afraid, Rape, and Kick questionnaire    Fear of Current or Ex-Partner: No    Emotionally Abused: No    Physically Abused: No    Sexually Abused: No    Lab Results  Component Value Date   HGBA1C 8.0 (A) 11/14/2022   No results found for: "CHOL" No results found for: "HDL" No results found for: "LDLCALC" No results found for: "TRIG" No results found for: "CHOLHDL" Lab Results  Component Value Date   CREATININE 0.94 04/27/2022   No results found for: "GFR" No results found for: "MICROALBUR", "MALB24HUR"    Component Value Date/Time   NA 140 04/27/2022 0938   K 4.2 04/27/2022 0938   CL 107 04/27/2022 0938   CO2 24 04/27/2022 0938   GLUCOSE 173 (H) 04/27/2022 0938   BUN 19 04/27/2022 0938   CREATININE 0.94 04/27/2022 0938   CALCIUM 9.2 04/27/2022 0938   PROT 7.3 04/27/2022 0938   ALBUMIN 4.1 04/27/2022 0938   AST 24 04/27/2022 0938   ALT 25 04/27/2022 0938   ALKPHOS 95 04/27/2022 0938   BILITOT 0.6 04/27/2022 0938   GFRNONAA >60 04/27/2022 0938      Latest Ref Rng & Units 04/27/2022    9:38 AM  BMP  Glucose 70 - 99 mg/dL 161   BUN 8 - 23 mg/dL 19   Creatinine 0.96 - 1.24 mg/dL 0.45   Sodium 409 - 811 mmol/L 140   Potassium  3.5 - 5.1 mmol/L 4.2   Chloride 98 - 111 mmol/L 107   CO2 22 - 32 mmol/L 24    Calcium 8.9 - 10.3 mg/dL 9.2        Component Value Date/Time   WBC 7.6 04/27/2022 0938   RBC 4.21 (L) 05/25/2022 1007   RBC 4.32 04/27/2022 0938   HGB 9.2 (L) 04/27/2022 0938   HCT 32.7 (L) 04/27/2022 0938   PLT 320 04/27/2022 0938   MCV 75.7 (L) 04/27/2022 0938   MCH 21.3 (L) 04/27/2022 0938   MCHC 28.1 (L) 04/27/2022 0938   RDW 16.7 (H) 04/27/2022 0938   LYMPHSABS 1.8 04/27/2022 0938   MONOABS 0.6 04/27/2022 0938   EOSABS 0.3 04/27/2022 0938   BASOSABS 0.1 04/27/2022 0938     Parts of this note may have been dictated using voice recognition software. There may be variances in spelling and vocabulary which are unintentional. Not all errors are proofread. Please notify the There[facility] Parkin if any discrepancies are noted or if the meaning of any statement is not clear.

## 2022-12-14 NOTE — Telephone Encounter (Signed)
Medication Samples have been provided to the patient.  Drug name: Jardiance       Strength: 25mg      Qty: 2 boxes  LOT: [id number] Exp.Date: 11/26  Dosing instructions: 1 tablet daily  The patient has been instructed regarding the correct time, dose, and frequency of taking this medication, including desired effects and most common side effects.   Bobby Downs 3:30 PM 12/14/2022

## 2022-12-15 ENCOUNTER — Encounter: Payer: Self-pay | Admitting: "Endocrinology

## 2022-12-23 ENCOUNTER — Other Ambulatory Visit: Payer: Self-pay

## 2023-01-02 ENCOUNTER — Encounter: Payer: Self-pay | Admitting: "Endocrinology

## 2023-01-02 ENCOUNTER — Ambulatory Visit: Payer: Medicare Other | Admitting: "Endocrinology

## 2023-01-02 VITALS — BP 122/60 | HR 90 | Resp 16 | Ht 68.0 in | Wt 164.5 lb

## 2023-01-02 DIAGNOSIS — Z7984 Long term (current) use of oral hypoglycemic drugs: Secondary | ICD-10-CM

## 2023-01-02 DIAGNOSIS — E7800 Pure hypercholesterolemia, unspecified: Secondary | ICD-10-CM

## 2023-01-02 DIAGNOSIS — Z794 Long term (current) use of insulin: Secondary | ICD-10-CM | POA: Diagnosis not present

## 2023-01-02 DIAGNOSIS — E1165 Type 2 diabetes mellitus with hyperglycemia: Secondary | ICD-10-CM

## 2023-01-02 MED ORDER — TRESIBA FLEXTOUCH 200 UNIT/ML ~~LOC~~ SOPN: 66.0000 [IU] | PEN_INJECTOR | Freq: Every day | SUBCUTANEOUS | 1 refills | Status: DC

## 2023-01-02 MED ORDER — INSULIN LISPRO (1 UNIT DIAL) 100 UNIT/ML (KWIKPEN): 10.0000 [IU] | PEN_INJECTOR | Freq: Three times a day (TID) | SUBCUTANEOUS | 1 refills | Status: AC

## 2023-01-02 NOTE — Progress Notes (Signed)
Outpatient Endocrinology Note Bobby [place], MD  01/02/23   Bobby Downs [birth year] [id number]  Referring Provider: Paulina Fusi, MD Primary Care Provider: Paulina Fusi, MD Reason for consultation: Subjective   Assessment & Plan  Diagnoses and all orders for this visit:  Uncontrolled type 2 diabetes mellitus with hyperglycemia (HCC) -     insulin degludec (TRESIBA FLEXTOUCH) 200 UNIT/ML FlexTouch Pen; Inject 66 Units into the skin daily at 6 (six) AM.  Long-term insulin use (HCC)  Long term (current) use of oral hypoglycemic drugs  Pure hypercholesterolemia  Other orders -     insulin lispro (HUMALOG KWIKPEN) 100 UNIT/ML KwikPen; Inject 10 Units into the skin 3 (three) times daily.     Diabetes complicated by neuropathy  Hba1c goal less than 7.0, current Hba1c is 8.2. Will recommend for the following change of medications to: Tresiba 66 units once every morning Humalog 8-12 units before break fast, 6 units before lunch, and 8-12 units before dinner  If blood sugar is more than 200 when you are skipping meal, take 3 units of Humalog   Do not take if sugar is less than 100 Farxiga 10 mg once a day (on jardiance 25 mg every day samples until can afford farxiga)  Not interested in insulin pump No known contraindications to any of above medications Ordered Baqsimi on 12/14/22 and explained its use    Hyperlipidemia -Last LDL near goal: 83 -on pravastatin 80 mg QD -Follow low fat diet and exercise   -Blood pressure goal <140/90 - Microalbumin/creatinine not done, ordered it -On lisinopril 10 mg qd -diet changes including salt restriction -limit eating outside -counseled BP targets per standards of diabetes care -Uncontrolled blood pressure can lead to retinopathy, nephropathy and cardiovascular and atherosclerotic heart disease  Reviewed and counseled on: -A1C target -Blood sugar targets -Complications of uncontrolled diabetes  -Checking  blood sugar before meals and bedtime and bring log next visit -All medications with mechanism of action and side effects -Hypoglycemia management: rule of 15's, Glucagon Emergency Kit and medical alert ID -low-carb low-fat plate-method diet -At least 20 minutes of physical activity per day -Annual dilated retinal eye exam and foot exam -compliance and follow up needs -follow up as scheduled or earlier if problem gets worse  Call if blood sugar is less than 70 or consistently above 250    Take a 15 gm snack of carbohydrate at bedtime before you go to sleep if your blood sugar is less than 100.    If you are going to fast after midnight for a test or procedure, ask your physician for instructions on how to reduce/decrease your insulin dose.    Call if blood sugar is less than 70 or consistently above 250  -Treating a low sugar by rule of 15  (15 gms of sugar every 15 min until sugar is more than 70) If you feel your sugar is low, test your sugar to be sure If your sugar is low (less than 70), then take 15 grams of a fast acting Carbohydrate (3-4 glucose tablets or glucose gel or 4 ounces of juice or regular soda) Recheck your sugar 15 min after treating low to make sure it is more than 70 If sugar is still less than 70, treat again with 15 grams of carbohydrate          Don't drive the hour of hypoglycemia  If unconscious/unable to eat or drink by mouth, use glucagon injection or nasal spray  baqsimi and call 911. Can repeat again in 15 min if still unconscious.  Return in about 17 days (around 01/19/2023).   I have reviewed current medications, nurse's notes, allergies, vital signs, past medical and surgical history, family medical history, and social history for this encounter. Counseled patient on symptoms, examination findings, lab findings, imaging results, treatment decisions and monitoring and prognosis. The patient understood the recommendations and agrees with the treatment plan. All  questions regarding treatment plan were fully answered.  Bobby [place], MD  01/02/23   History of Present Illness Bobby Downs is a 67 y.o. year old male who presents for follow up of Type 2 diabetes mellitus.  Bobby Downs was first diagnosed around 2007.   Diabetes education +  Home diabetes regimen: Tresiba 66 units once every morning Humalog 6 units before meals Farxiga 10 mg once a day/jardiance 25 mg every day   COMPLICATIONS + Stroke -  retinopathy, last eye exam 2023 + neuropathy -  nephropathy  Labs reviewed  06/22/21 GFR 97 Cr 0.9 AST 12 ALT 18 Tg 85 HDL 51 VLDL 16 LDL 71  BLOOD SUGAR DATA  CGM interpretation: At today's visit, we reviewed her CGM downloads. The full report is scanned in the media. Reviewing the CGM trends, BG are elevated across the day with some normal blood suagrs in afternoon.  Physical Exam  BP 122/60   Pulse 90   Resp 16   Ht 5\' 8"  (1.727 m)   Wt 164 lb 8 oz (74.6 kg)   SpO2 95%   BMI 25.01 kg/m    Constitutional: well developed, well nourished Head: normocephalic, atraumatic Eyes: sclera anicteric, no redness Neck: supple Lungs: normal respiratory effort Neurology: alert and oriented Skin: dry, no appreciable rashes Musculoskeletal: no appreciable defects Psychiatric: normal mood and affect    Current Medications Patient's Medications  New Prescriptions   No medications on file  Previous Medications   ASPIRIN EC 81 MG TABLET    Take 81 mg by mouth daily. Swallow whole.   CLOPIDOGREL (PLAVIX) 75 MG TABLET    Take 75 mg by mouth daily.   FARXIGA 10 MG TABS TABLET    Take 10 mg by mouth daily.   FERROUS FUMARATE (IRON) 18 MG TBCR    Take 18 mg by mouth daily.   GLUCAGON (BAQSIMI ONE PACK) 3 MG/DOSE POWD    Place 1 Device into the nose as needed (Low blood sugar with impaired consciousness).   LATANOPROST (XALATAN) 0.005 % OPHTHALMIC SOLUTION    1 drop at bedtime.   LISINOPRIL (ZESTRIL) 10 MG TABLET     Take 1 tablet by mouth daily.   MULTIPLE VITAMIN (MULTIVITAMIN) CAPSULE    Take 1 capsule by mouth daily.   PRAVASTATIN (PRAVACHOL) 80 MG TABLET    Take 80 mg by mouth daily.   TIMOLOL (TIMOPTIC) 0.5 % OPHTHALMIC SOLUTION    1 drop daily.  Modified Medications   Modified Medication Previous Medication   INSULIN DEGLUDEC (TRESIBA FLEXTOUCH) 200 UNIT/ML FLEXTOUCH PEN insulin degludec (TRESIBA FLEXTOUCH) 200 UNIT/ML FlexTouch Pen      Inject 66 Units into the skin daily at 6 (six) AM.    Inject 76 Units into the skin daily at 6 (six) AM.   INSULIN LISPRO (HUMALOG KWIKPEN) 100 UNIT/ML KWIKPEN insulin lispro (HUMALOG KWIKPEN) 100 UNIT/ML KwikPen      Inject 10 Units into the skin 3 (three) times daily.    Inject 5 Units into the skin 3 (three) times  daily.  Discontinued Medications   No medications on file    Allergies No Known Allergies  Past Medical History Past Medical History:  Diagnosis Date   Diabetes mellitus without complication (HCC)    Diabetic retinopathy (HCC)    Glaucoma    History of CVA (cerebrovascular accident)    History of melanoma    History of tobacco use    Hypertension    Insulin dependent type 2 diabetes mellitus (HCC)     Past Surgical History Past Surgical History:  Procedure Laterality Date   DEEP NECK LYMPH NODE BIOPSY / EXCISION      Family History family history includes Brain cancer (age of onset: 30) in his brother; Breast cancer in his mother; Breast cancer (age of onset: 46) in his sister; Cancer in his maternal grandfather; Colon cancer (age of onset: 82) in his mother; Diabetes type II in his mother; Hearing loss in his sister; Hyperlipidemia in his mother; Hypertension in his mother; Seizures in his brother.  Social History Social History   Socioeconomic History   Marital status: Married    Spouse name: Not on file   Number of children: Not on file   Years of education: Not on file   Highest education level: Not on file  Occupational  History   Not on file  Tobacco Use   Smoking status: Every Day    Current packs/day: 1.00    Average packs/day: 1 pack/day for 40.0 years (40.0 ttl pk-yrs)    Types: Cigarettes   Smokeless tobacco: Never  Vaping Use   Vaping status: Never Used  Substance and Sexual Activity   Alcohol use: Not Currently   Drug use: Never   Sexual activity: Not on file  Other Topics Concern   Not on file  Social History Narrative   Not on file   Social Drivers of Health   Financial Resource Strain: Not on file  Food Insecurity: No Food Insecurity (04/27/2022)   Hunger Vital Sign    Worried About Running Out of Food in the Last Year: Never true    Ran Out of Food in the Last Year: Never true  Transportation Needs: No Transportation Needs (04/27/2022)   PRAPARE - Administrator, Civil Service (Medical): No    Lack of Transportation (Non-Medical): No  Physical Activity: Not on file  Stress: Not on file  Social Connections: Not on file  Intimate Partner Violence: Not At Risk (04/27/2022)   Humiliation, Afraid, Rape, and Kick questionnaire    Fear of Current or Ex-Partner: No    Emotionally Abused: No    Physically Abused: No    Sexually Abused: No    Lab Results  Component Value Date   HGBA1C 8.0 (A) 11/14/2022   No results found for: "CHOL" No results found for: "HDL" No results found for: "LDLCALC" No results found for: "TRIG" No results found for: "CHOLHDL" Lab Results  Component Value Date   CREATININE 0.94 04/27/2022   No results found for: "GFR" No results found for: "MICROALBUR", "MALB24HUR"    Component Value Date/Time   NA 140 04/27/2022 0938   K 4.2 04/27/2022 0938   CL 107 04/27/2022 0938   CO2 24 04/27/2022 0938   GLUCOSE 173 (H) 04/27/2022 0938   BUN 19 04/27/2022 0938   CREATININE 0.94 04/27/2022 0938   CALCIUM 9.2 04/27/2022 0938   PROT 7.3 04/27/2022 0938   ALBUMIN 4.1 04/27/2022 0938   AST 24 04/27/2022 0938   ALT 25  04/27/2022 0938   ALKPHOS 95  04/27/2022 0938   BILITOT 0.6 04/27/2022 0938   GFRNONAA >60 04/27/2022 0938      Latest Ref Rng & Units 04/27/2022    9:38 AM  BMP  Glucose 70 - 99 mg/dL 601   BUN 8 - 23 mg/dL 19   Creatinine 0.93 - 1.24 mg/dL 2.35   Sodium 573 - 220 mmol/L 140   Potassium 3.5 - 5.1 mmol/L 4.2   Chloride 98 - 111 mmol/L 107   CO2 22 - 32 mmol/L 24   Calcium 8.9 - 10.3 mg/dL 9.2        Component Value Date/Time   WBC 7.6 04/27/2022 0938   RBC 4.21 (L) 05/25/2022 1007   RBC 4.32 04/27/2022 0938   HGB 9.2 (L) 04/27/2022 0938   HCT 32.7 (L) 04/27/2022 0938   PLT 320 04/27/2022 0938   MCV 75.7 (L) 04/27/2022 0938   MCH 21.3 (L) 04/27/2022 0938   MCHC 28.1 (L) 04/27/2022 0938   RDW 16.7 (H) 04/27/2022 0938   LYMPHSABS 1.8 04/27/2022 0938   MONOABS 0.6 04/27/2022 0938   EOSABS 0.3 04/27/2022 0938   BASOSABS 0.1 04/27/2022 0938     Parts of this note may have been dictated using voice recognition software. There may be variances in spelling and vocabulary which are unintentional. Not all errors are proofread. Please notify the [facility] Parkin if any discrepancies are noted or if the meaning of any statement is not clear.

## 2023-01-02 NOTE — Patient Instructions (Addendum)
Will recommend for the following change of medications to: Tresiba 66 units once every morning Humalog 8-12 units before break fast, 6 units before lunch, and 8-12 units before dinner  Do not take if sugar is less than 100 Farxiga 10 mg once a day/jardiance 25 mg every day   ____________   Goals of DM therapy:  Morning Fasting blood sugar: 80-140  Blood sugar before meals: 80-140 Bed time blood sugar: 100-150  A1C <7%, limited only by hypoglycemia  1.Diabetes medications and their side effects discussed, including hypoglycemia    2. Check blood glucose:  a) Always check blood sugars before driving. Please see below (under hypoglycemia) on how to manage b) Check a minimum of 3 times/day or more as needed when having symptoms of hypoglycemia.   c) Try to check blood glucose before sleeping/in the middle of the night to ensure that it is remaining stable and not dropping less than 100 d) Check blood glucose more often if sick  3. Diet: a) 3 meals per day schedule b: Restrict carbs to 60-70 grams (4 servings) per meal c) Colorful vegetables - 3 servings a day, and low sugar fruit 2 servings/day Plate control method: 1/4 plate protein, 1/4 starch, 1/2 green, yellow, or red vegetables d) Avoid carbohydrate snacks unless hypoglycemic episode, or increased physical activity  4. Regular exercise as tolerated, preferably 3 or more hours a week  5. Hypoglycemia: a)  Do not drive or operate machinery without first testing blood glucose to assure it is over 90 mg%, or if dizzy, lightheaded, not feeling normal, etc, or  if foot or leg is numb or weak. b)  If blood glucose less than 70, take four 5gm Glucose tabs or 15-30 gm Glucose gel.  Repeat every 15 min as needed until blood sugar is >100 mg/dl. If hypoglycemia persists then call 911.   6. Sick day management: a) Check blood glucose more often b) Continue usual therapy if blood sugars are elevated.   7. Contact the doctor immediately  if blood glucose is frequently <60 mg/dl, or an episode of severe hypoglycemia occurs (where someone had to give you glucose/  glucagon or if you passed out from a low blood glucose), or if blood glucose is persistently >350 mg/dl, for further management  8. A change in level of physical activity or exercise and a change in diet may also affect your blood sugar. Check blood sugars more often and call if needed.  Instructions: 1. Bring glucose meter, blood glucose records on every visit for review 2. Continue to follow up with primary care physician and other providers for medical care 3. Yearly eye  and foot exam 4. Please get blood work done prior to the next appointment

## 2023-01-02 NOTE — Progress Notes (Signed)
Samples of this drug Jardiance were given to the patient, quantity 1, Lot Number [id number]

## 2023-01-03 ENCOUNTER — Encounter: Payer: Self-pay | Admitting: "Endocrinology

## 2023-01-03 ENCOUNTER — Other Ambulatory Visit: Payer: Self-pay | Admitting: "Endocrinology

## 2023-01-03 MED ORDER — FREESTYLE LIBRE 3 SENSOR MISC: 1.0000 | 3 refills | Status: DC

## 2023-01-23 ENCOUNTER — Encounter: Payer: Self-pay | Admitting: "Endocrinology

## 2023-01-23 ENCOUNTER — Ambulatory Visit: Payer: Medicare Other | Admitting: "Endocrinology

## 2023-01-23 VITALS — BP 130/70 | HR 88 | Resp 20 | Ht 68.0 in | Wt 170.6 lb

## 2023-01-23 DIAGNOSIS — Z794 Long term (current) use of insulin: Secondary | ICD-10-CM

## 2023-01-23 DIAGNOSIS — E1165 Type 2 diabetes mellitus with hyperglycemia: Secondary | ICD-10-CM

## 2023-01-23 DIAGNOSIS — E7800 Pure hypercholesterolemia, unspecified: Secondary | ICD-10-CM | POA: Diagnosis not present

## 2023-01-23 DIAGNOSIS — Z7984 Long term (current) use of oral hypoglycemic drugs: Secondary | ICD-10-CM | POA: Diagnosis not present

## 2023-01-23 NOTE — Progress Notes (Signed)
Outpatient Endocrinology Note Bobby Aldine, MD  01/23/23   Bobby Downs 12-24-1955 [id number]  Referring Provider: Paulina Fusi, MD Primary Care Provider: Paulina Fusi, MD Reason for consultation: Subjective   Assessment & Plan  Diagnoses and all orders for this visit:  Uncontrolled type 2 diabetes mellitus with hyperglycemia (HCC)  Long-term insulin use (HCC)  Long term (current) use of oral hypoglycemic drugs  Pure hypercholesterolemia   Diabetes complicated by neuropathy  Hba1c goal less than 7.0, current Hba1c is 8.2. Will recommend for the following change of medications to: Tresiba 66 units once every morning Humalog 10 units before break fast, 8 units before lunch, and 15-16 units before dinner 15 min before meals  If blood sugar is more than 200 when you are skipping meal, take 3 units of Humalog   Do not take if sugar is less than 100 Farxiga 10 mg once a day  Not interested in insulin pump No known contraindications to any of above medications Ordered Baqsimi on 12/14/22 and explained its use    Hyperlipidemia -Last LDL near goal: 83 -on pravastatin 80 mg QD -Follow low fat diet and exercise   -Blood pressure goal <140/90 - Microalbumin/creatinine not done, ordered it -On lisinopril 10 mg qd -diet changes including salt restriction -limit eating outside -counseled BP targets per standards of diabetes care -Uncontrolled blood pressure can lead to retinopathy, nephropathy and cardiovascular and atherosclerotic heart disease  Reviewed and counseled on: -A1C target -Blood sugar targets -Complications of uncontrolled diabetes  -Checking blood sugar before meals and bedtime and bring log next visit -All medications with mechanism of action and side effects -Hypoglycemia management: rule of 15's, Glucagon Emergency Kit and medical alert ID -low-carb low-fat plate-method diet -At least 20 minutes of physical activity per  day -Annual dilated retinal eye exam and foot exam -compliance and follow up needs -follow up as scheduled or earlier if problem gets worse  Call if blood sugar is less than 70 or consistently above 250    Take a 15 gm snack of carbohydrate at bedtime before you go to sleep if your blood sugar is less than 100.    If you are going to fast after midnight for a test or procedure, ask your physician for instructions on how to reduce/decrease your insulin dose.    Call if blood sugar is less than 70 or consistently above 250  -Treating a low sugar by rule of 15  (15 gms of sugar every 15 min until sugar is more than 70) If you feel your sugar is low, test your sugar to be sure If your sugar is low (less than 70), then take 15 grams of a fast acting Carbohydrate (3-4 glucose tablets or glucose gel or 4 ounces of juice or regular soda) Recheck your sugar 15 min after treating low to make sure it is more than 70 If sugar is still less than 70, treat again with 15 grams of carbohydrate          Don't drive the hour of hypoglycemia  If unconscious/unable to eat or drink by mouth, use glucagon injection or nasal spray baqsimi and call 911. Can repeat again in 15 min if still unconscious.  Return in about 23 days (around 02/15/2023).   I have reviewed current medications, nurse's notes, allergies, vital signs, past medical and surgical history, family medical history, and social history for this encounter. Counseled patient on symptoms, examination findings, lab findings, imaging results, treatment  decisions and monitoring and prognosis. The patient understood the recommendations and agrees with the treatment plan. All questions regarding treatment plan were fully answered.  Bobby [place], MD  01/23/23   History of Present Illness Bobby Downs is a 68 y.o. year old male who presents for follow up of Type 2 diabetes mellitus.  Bobby Downs was first diagnosed around 2007.   Diabetes  education +  Home diabetes regimen: Tresiba 60 units once every morning Humalog 12 units before break fast, 10 units before lunch, and 12 units before dinner 15 min before meals  Farxiga 10 mg once a day/jardiance 25 mg every day   COMPLICATIONS + Stroke -  retinopathy, last eye exam 2023 + neuropathy -  nephropathy  Labs reviewed  06/22/21 GFR 97 Cr 0.9 AST 12 ALT 18 Tg 85 HDL 51 VLDL 16 LDL 71  BLOOD SUGAR DATA  CGM interpretation: At today's visit, we reviewed her CGM downloads. The full report is scanned in the media. Reviewing the CGM trends, BG are elevated across the day with normal-low blood suagrs in afternoon.  Physical Exam  BP 130/70 (BP Location: Right Arm, Patient Position: Sitting, Cuff Size: Normal)   Pulse 88   Resp 20   Ht 5\' 8"  (1.727 m)   Wt 170 lb 9.6 oz (77.4 kg)   SpO2 96%   BMI 25.94 kg/m    Constitutional: well developed, well nourished Head: normocephalic, atraumatic Eyes: sclera anicteric, no redness Neck: supple Lungs: normal respiratory effort Neurology: alert and oriented Skin: dry, no appreciable rashes Musculoskeletal: no appreciable defects Psychiatric: normal mood and affect    Current Medications Patient's Medications  New Prescriptions   No medications on file  Previous Medications   ASPIRIN EC 81 MG TABLET    Take 81 mg by mouth daily. Swallow whole.   CLOPIDOGREL (PLAVIX) 75 MG TABLET    Take 75 mg by mouth daily.   CONTINUOUS GLUCOSE SENSOR (FREESTYLE LIBRE 3 SENSOR) MISC    1 Device by Does not apply route continuous.   FARXIGA 10 MG TABS TABLET    Take 10 mg by mouth daily.   FERROUS FUMARATE (IRON) 18 MG TBCR    Take 18 mg by mouth daily.   GLUCAGON (BAQSIMI ONE PACK) 3 MG/DOSE POWD    Place 1 Device into the nose as needed (Low blood sugar with impaired consciousness).   INSULIN DEGLUDEC (TRESIBA FLEXTOUCH) 200 UNIT/ML FLEXTOUCH PEN    Inject 66 Units into the skin daily at 6 (six) AM.   INSULIN LISPRO (HUMALOG  KWIKPEN) 100 UNIT/ML KWIKPEN    Inject 10 Units into the skin 3 (three) times daily.   LATANOPROST (XALATAN) 0.005 % OPHTHALMIC SOLUTION    1 drop at bedtime.   LISINOPRIL (ZESTRIL) 10 MG TABLET    Take 1 tablet by mouth daily.   MULTIPLE VITAMIN (MULTIVITAMIN) CAPSULE    Take 1 capsule by mouth daily.   PRAVASTATIN (PRAVACHOL) 80 MG TABLET    Take 80 mg by mouth daily.   TIMOLOL (TIMOPTIC) 0.5 % OPHTHALMIC SOLUTION    1 drop daily.  Modified Medications   No medications on file  Discontinued Medications   No medications on file    Allergies No Known Allergies  Past Medical History Past Medical History:  Diagnosis Date   Diabetes mellitus without complication (HCC)    Diabetic retinopathy (HCC)    Glaucoma    History of CVA (cerebrovascular accident)    History of melanoma  History of tobacco use    Hypertension    Insulin dependent type 2 diabetes mellitus (HCC)     Past Surgical History Past Surgical History:  Procedure Laterality Date   DEEP NECK LYMPH NODE BIOPSY / EXCISION      Family History family history includes Brain cancer (age of onset: 44) in his brother; Breast cancer in his mother; Breast cancer (age of onset: 94) in his sister; Cancer in his maternal grandfather; Colon cancer (age of onset: 1) in his mother; Diabetes type II in his mother; Hearing loss in his sister; Hyperlipidemia in his mother; Hypertension in his mother; Seizures in his brother.  Social History Social History   Socioeconomic History   Marital status: Married    Spouse name: Not on file   Number of children: Not on file   Years of education: Not on file   Highest education level: Not on file  Occupational History   Not on file  Tobacco Use   Smoking status: Every Day    Current packs/day: 1.00    Average packs/day: 1 pack/day for 40.0 years (40.0 ttl pk-yrs)    Types: Cigarettes   Smokeless tobacco: Never  Vaping Use   Vaping status: Never Used  Substance and Sexual  Activity   Alcohol use: Not Currently   Drug use: Never   Sexual activity: Not on file  Other Topics Concern   Not on file  Social History Narrative   Not on file   Social Drivers of Health   Financial Resource Strain: Not on file  Food Insecurity: No Food Insecurity (04/27/2022)   Hunger Vital Sign    Worried About Running Out of Food in the Last Year: Never true    Ran Out of Food in the Last Year: Never true  Transportation Needs: No Transportation Needs (04/27/2022)   PRAPARE - Administrator, Civil Service (Medical): No    Lack of Transportation (Non-Medical): No  Physical Activity: Not on file  Stress: Not on file  Social Connections: Not on file  Intimate Partner Violence: Not At Risk (04/27/2022)   Humiliation, Afraid, Rape, and Kick questionnaire    Fear of Current or Ex-Partner: No    Emotionally Abused: No    Physically Abused: No    Sexually Abused: No    Lab Results  Component Value Date   HGBA1C 8.0 (A) 11/14/2022   No results found for: "CHOL" No results found for: "HDL" No results found for: "LDLCALC" No results found for: "TRIG" No results found for: "CHOLHDL" Lab Results  Component Value Date   CREATININE 0.94 04/27/2022   No results found for: "GFR" No results found for: "MICROALBUR", "[id number]"    Component Value Date/Time   NA 140 04/27/2022 0938   K 4.2 04/27/2022 0938   CL 107 04/27/2022 0938   CO2 24 04/27/2022 0938   GLUCOSE 173 (H) 04/27/2022 0938   BUN 19 04/27/2022 0938   CREATININE 0.94 04/27/2022 0938   CALCIUM 9.2 04/27/2022 0938   PROT 7.3 04/27/2022 0938   ALBUMIN 4.1 04/27/2022 0938   AST 24 04/27/2022 0938   ALT 25 04/27/2022 0938   ALKPHOS 95 04/27/2022 0938   BILITOT 0.6 04/27/2022 0938   GFRNONAA >60 04/27/2022 0938      Latest Ref Rng & Units 04/27/2022    9:38 AM  BMP  Glucose 70 - 99 mg/dL 272   BUN 8 - 23 mg/dL 19   Creatinine 5.36 - 1.24 mg/dL 6.44  Sodium 135 - 145 mmol/L 140   Potassium 3.5 -  5.1 mmol/L 4.2   Chloride 98 - 111 mmol/L 107   CO2 22 - 32 mmol/L 24   Calcium 8.9 - 10.3 mg/dL 9.2        Component Value Date/Time   WBC 7.6 04/27/2022 0938   RBC 4.21 (L) 05/25/2022 1007   RBC 4.32 04/27/2022 0938   HGB 9.2 (L) 04/27/2022 0938   HCT 32.7 (L) 04/27/2022 0938   PLT 320 04/27/2022 0938   MCV 75.7 (L) 04/27/2022 0938   MCH 21.3 (L) 04/27/2022 0938   MCHC 28.1 (L) 04/27/2022 0938   RDW 16.7 (H) 04/27/2022 0938   LYMPHSABS 1.8 04/27/2022 0938   MONOABS 0.6 04/27/2022 0938   EOSABS 0.3 04/27/2022 0938   BASOSABS 0.1 04/27/2022 0938     Parts of this note may have been dictated using voice recognition software. There may be variances in spelling and vocabulary which are unintentional. Not all errors are proofread. Please notify the Thereasa Parkin if any discrepancies are noted or if the meaning of any statement is not clear.

## 2023-01-23 NOTE — Patient Instructions (Addendum)
Tresiba 66 units once every morning Humalog 10 units before break fast, 8 units before lunch - 15 units before dinner

## 2023-01-24 ENCOUNTER — Encounter: Payer: Self-pay | Admitting: "Endocrinology

## 2023-01-27 DIAGNOSIS — H401134 Primary open-angle glaucoma, bilateral, indeterminate stage: Secondary | ICD-10-CM | POA: Diagnosis not present

## 2023-02-07 DIAGNOSIS — I10 Essential (primary) hypertension: Secondary | ICD-10-CM | POA: Diagnosis not present

## 2023-02-07 DIAGNOSIS — E782 Mixed hyperlipidemia: Secondary | ICD-10-CM | POA: Diagnosis not present

## 2023-02-07 DIAGNOSIS — Z85820 Personal history of malignant melanoma of skin: Secondary | ICD-10-CM | POA: Diagnosis not present

## 2023-02-07 DIAGNOSIS — E113299 Type 2 diabetes mellitus with mild nonproliferative diabetic retinopathy without macular edema, unspecified eye: Secondary | ICD-10-CM | POA: Diagnosis not present

## 2023-02-07 DIAGNOSIS — Z6824 Body mass index (BMI) 24.0-24.9, adult: Secondary | ICD-10-CM | POA: Diagnosis not present

## 2023-02-07 DIAGNOSIS — Z8673 Personal history of transient ischemic attack (TIA), and cerebral infarction without residual deficits: Secondary | ICD-10-CM | POA: Diagnosis not present

## 2023-02-08 LAB — TSH: TSH: 0.01 — AB (ref 0.41–5.90)

## 2023-02-20 ENCOUNTER — Telehealth: Payer: Self-pay | Admitting: "Endocrinology

## 2023-02-20 DIAGNOSIS — E1165 Type 2 diabetes mellitus with hyperglycemia: Secondary | ICD-10-CM

## 2023-02-20 NOTE — Telephone Encounter (Signed)
 Pt has been notified and voices understanding.

## 2023-02-20 NOTE — Telephone Encounter (Signed)
 J, I am looking at Dr. Grafton Folk last note which has sensor data from the end of last month.  It appears that his sugars are high throughout the night and then they drop significantly after he wakes up just to go up again after lunch.   I suspect that his dose with breakfast may be too high and his blood sugars drop so low that he cannot take enough insulin before lunch, with subsequent very high blood sugars after this meal. I would suggest the following: Decrease: - Tresiba 66 >> 60 units daily - Humalog 10 >> 6 units before breakfast, 8 >> 12 units before lunch and 15 units before dinner Ty! C

## 2023-02-20 NOTE — Telephone Encounter (Signed)
 Patient called back and was able to provide me readings.  His Lowest reading was 50 (when he passed out ) Highest: 340  113, 85.92,146,277 (all readings from just this morning with the 277 being the 1st reading.) 29,562,130,865,78,469,629,528,413,244,010,27,25,366  Currently taking: Farxiga 10 mg 1 every day, Tresiba 66 units given between 6am and 8 am, Humalog: 10 units am, 8 at lunch, and 16 at dinner.   Please advise in Dr. Roosevelt Locks absence

## 2023-02-20 NOTE — Telephone Encounter (Signed)
 LMTRC, we need to know the exact readings. His CGM is not connected to the clinic since he has a reader and to also go over his medications before we send this message to a provider.   J.Leinaala Catanese,RMA

## 2023-02-20 NOTE — Telephone Encounter (Signed)
 We called patient to cancel the appointment with Dr. Roosevelt Locks on Wednesday, 02/22/2023.  Patient states that he has been having problems with his blood sugar levels.  He passed out on Saturday, 02/11/2023 and his wife called 911.  Patient states that with in the last 7 days he has had 4 low glucose events.  Within the last 14 days he has had 7 low glucose events and within the last 30 days he has had 14 low glucose events.  Patient states that his current reading is 90 and there is an arrow pointing down to indicate that it is going down again.  Patient would like to know what he should do.

## 2023-02-22 ENCOUNTER — Ambulatory Visit: Payer: Medicare Other | Admitting: "Endocrinology

## 2023-03-08 MED ORDER — TRESIBA FLEXTOUCH 200 UNIT/ML ~~LOC~~ SOPN: 64.0000 [IU] | PEN_INJECTOR | Freq: Every day | SUBCUTANEOUS | 1 refills | Status: DC

## 2023-03-08 NOTE — Telephone Encounter (Signed)
 Please advise on pt blood sugar.

## 2023-03-08 NOTE — Addendum Note (Signed)
 Addended by: Beverely Pace on: 03/08/2023 04:15 PM   Modules accepted: Orders

## 2023-03-08 NOTE — Telephone Encounter (Signed)
 Spoke to pt regarding his medication pt is currently taking Farxiga 10 mg daily tresiba 66 units daily and Humalog 10 units in the morning,8 units in the afternoon and 16 units at night.

## 2023-03-08 NOTE — Telephone Encounter (Signed)
 Pt informed of medication changes. Pt requested a refill of his tresiba, refill sent.  Requested Prescriptions   Signed Prescriptions Disp Refills   insulin degludec (TRESIBA FLEXTOUCH) 200 UNIT/ML FlexTouch Pen 27 mL 1    Sig: Inject 64 Units into the skin daily at 6 (six) AM.    Authorizing Provider: [facility] [place]    Ordering User: Beverely Pace

## 2023-03-08 NOTE — Telephone Encounter (Signed)
 I called patient to reschedule and he said he is still having issues with his blood sugar. He said it had dropped to 53 last night. Today its been at about 200. Please advise

## 2023-03-14 ENCOUNTER — Encounter: Payer: Self-pay | Admitting: "Endocrinology

## 2023-03-14 ENCOUNTER — Ambulatory Visit: Admitting: "Endocrinology

## 2023-03-14 VITALS — BP 140/70 | HR 87 | Ht <= 58 in | Wt 168.2 lb

## 2023-03-14 DIAGNOSIS — E1165 Type 2 diabetes mellitus with hyperglycemia: Secondary | ICD-10-CM

## 2023-03-14 DIAGNOSIS — E7800 Pure hypercholesterolemia, unspecified: Secondary | ICD-10-CM

## 2023-03-14 DIAGNOSIS — Z794 Long term (current) use of insulin: Secondary | ICD-10-CM | POA: Diagnosis not present

## 2023-03-14 DIAGNOSIS — Z7984 Long term (current) use of oral hypoglycemic drugs: Secondary | ICD-10-CM

## 2023-03-14 NOTE — Patient Instructions (Addendum)
 Will recommend for the following change of medications to: Tresiba 66 units once every morning Humalog 12 units before break fast, 8 units before lunch, and 10 before supper -15 min before meals  Farxiga 10 mg once a day

## 2023-03-14 NOTE — Progress Notes (Signed)
 Outpatient Endocrinology Note Bobby Divide, MD  03/14/23   Bobby Downs September 10, 1955 [id number]  Referring Provider: Paulina Fusi, MD Primary Care Provider: Paulina Fusi, MD Reason for consultation: Subjective   Assessment & Plan  Diagnoses and all orders for this visit:  Uncontrolled type 2 diabetes mellitus with hyperglycemia (HCC)  Long-term insulin use (HCC)  Long term (current) use of oral hypoglycemic drugs  Pure hypercholesterolemia   Diabetes complicated by neuropathy  Hba1c goal less than 7.0, current Hba1c is 8.2. 2025 Has a severe low episode with loss of consciousness. Did not remember Baqsimi. Had turned all alarms off in [place].  Will recommend for the following change of medications to: Tresiba 66 units once every morning Humalog 12 units before break fast, 8 units before lunch, and 10 before supper -15 min before meals  Farxiga 10 mg once a day Re-discussed use of Baqsimi Turned back all alarms on [place] after patients agreement and adjustment on parameters   Not interested in insulin pump No known contraindications to any of above medications Ordered Baqsimi on 12/14/22 and explained its use    Hyperlipidemia -Last LDL near goal: 83 -on pravastatin 80 mg QD -Follow low fat diet and exercise   -Blood pressure goal <140/90 - Microalbumin/creatinine not done, ordered it -On lisinopril 10 mg qd -diet changes including salt restriction -limit eating outside -counseled BP targets per standards of diabetes care -Uncontrolled blood pressure can lead to retinopathy, nephropathy and cardiovascular and atherosclerotic heart disease  Reviewed and counseled on: -A1C target -Blood sugar targets -Complications of uncontrolled diabetes  -Checking blood sugar before meals and bedtime and bring log next visit -All medications with mechanism of action and side effects -Hypoglycemia management: rule of 15's, Glucagon Emergency Kit and  medical alert ID -low-carb low-fat plate-method diet -At least 20 minutes of physical activity per day -Annual dilated retinal eye exam and foot exam -compliance and follow up needs -follow up as scheduled or earlier if problem gets worse  Call if blood sugar is less than 70 or consistently above 250    Take a 15 gm snack of carbohydrate at bedtime before you go to sleep if your blood sugar is less than 100.    If you are going to fast after midnight for a test or procedure, ask your physician for instructions on how to reduce/decrease your insulin dose.    Call if blood sugar is less than 70 or consistently above 250  -Treating a low sugar by rule of 15  (15 gms of sugar every 15 min until sugar is more than 70) If you feel your sugar is low, test your sugar to be sure If your sugar is low (less than 70), then take 15 grams of a fast acting Carbohydrate (3-4 glucose tablets or glucose gel or 4 ounces of juice or regular soda) Recheck your sugar 15 min after treating low to make sure it is more than 70 If sugar is still less than 70, treat again with 15 grams of carbohydrate          Don't drive the hour of hypoglycemia  If unconscious/unable to eat or drink by mouth, use glucagon injection or nasal spray baqsimi and call 911. Can repeat again in 15 min if still unconscious.  Return in about 4 weeks (around 04/11/2023).   I have reviewed current medications, nurse's notes, allergies, vital signs, past medical and surgical history, family medical history, and social history for this encounter.  Counseled patient on symptoms, examination findings, lab findings, imaging results, treatment decisions and monitoring and prognosis. The patient understood the recommendations and agrees with the treatment plan. All questions regarding treatment plan were fully answered.  Bobby Luling, MD  03/14/23   History of Present Illness Bobby VANDERSCHAAF is a 68 y.o. year old male who presents for follow  up of Type 2 diabetes mellitus.  [place] Austad Rinks was first diagnosed around 2007.   Diabetes education +  Home diabetes regimen: Tresiba 66 units once every morning Humalog 10 units before break fast, 8 before lunch, and 14 before supper -15 min before meals  Farxiga 10 mg once a day  COMPLICATIONS + Stroke -  retinopathy, last eye exam 2023 + neuropathy -  nephropathy  Labs reviewed  06/22/21 GFR 97 Cr 0.9 AST 12 ALT 18 Tg 85 HDL 51 VLDL 16 LDL 71  BLOOD SUGAR DATA  CGM interpretation: At today's visit, we reviewed her CGM downloads. The full report is scanned in the media. Reviewing the CGM trends, BG are elevated across the day with some lows in evening.  Physical Exam  BP (!) 140/70   Pulse 87   Ht 1' (0.305 m)   Wt 168 lb 3.2 oz (76.3 kg)   SpO2 96%   BMI 821.23 kg/m    Constitutional: well developed, well nourished Head: normocephalic, atraumatic Eyes: sclera anicteric, no redness Neck: supple Lungs: normal respiratory effort Neurology: alert and oriented Skin: dry, no appreciable rashes Musculoskeletal: no appreciable defects Psychiatric: normal mood and affect    Current Medications Patient's Medications  New Prescriptions   No medications on file  Previous Medications   ASPIRIN EC 81 MG TABLET    Take 81 mg by mouth daily. Swallow whole.   CLOPIDOGREL (PLAVIX) 75 MG TABLET    Take 75 mg by mouth daily.   CONTINUOUS GLUCOSE SENSOR (FREESTYLE LIBRE 3 SENSOR) MISC    1 Device by Does not apply route continuous.   FARXIGA 10 MG TABS TABLET    Take 10 mg by mouth daily.   FERROUS FUMARATE (IRON) 18 MG TBCR    Take 18 mg by mouth daily.   GLUCAGON (BAQSIMI ONE PACK) 3 MG/DOSE POWD    Place 1 Device into the nose as needed (Low blood sugar with impaired consciousness).   INSULIN DEGLUDEC (TRESIBA FLEXTOUCH) 200 UNIT/ML FLEXTOUCH PEN    Inject 64 Units into the skin daily at 6 (six) AM.   INSULIN LISPRO (HUMALOG KWIKPEN) 100 UNIT/ML KWIKPEN    Inject  10 Units into the skin 3 (three) times daily.   LATANOPROST (XALATAN) 0.005 % OPHTHALMIC SOLUTION    1 drop at bedtime.   LISINOPRIL (ZESTRIL) 10 MG TABLET    Take 1 tablet by mouth daily.   MULTIPLE VITAMIN (MULTIVITAMIN) CAPSULE    Take 1 capsule by mouth daily.   PRAVASTATIN (PRAVACHOL) 80 MG TABLET    Take 80 mg by mouth daily.   TIMOLOL (TIMOPTIC) 0.5 % OPHTHALMIC SOLUTION    1 drop daily.  Modified Medications   No medications on file  Discontinued Medications   No medications on file    Allergies No Known Allergies  Past Medical History Past Medical History:  Diagnosis Date   Diabetes mellitus without complication (HCC)    Diabetic retinopathy (HCC)    Glaucoma    History of CVA (cerebrovascular accident)    History of melanoma    History of tobacco use    Hypertension  Insulin dependent type 2 diabetes mellitus (HCC)     Past Surgical History Past Surgical History:  Procedure Laterality Date   DEEP NECK LYMPH NODE BIOPSY / EXCISION      Family History family history includes Brain cancer (age of onset: 68) in his brother; Breast cancer in his mother; Breast cancer (age of onset: 52) in his sister; Cancer in his maternal grandfather; Colon cancer (age of onset: 29) in his mother; Diabetes type II in his mother; Hearing loss in his sister; Hyperlipidemia in his mother; Hypertension in his mother; Seizures in his brother.  Social History Social History   Socioeconomic History   Marital status: Married    Spouse name: Not on file   Number of children: Not on file   Years of education: Not on file   Highest education level: Not on file  Occupational History   Not on file  Tobacco Use   Smoking status: Every Day    Current packs/day: 1.00    Average packs/day: 1 pack/day for 40.0 years (40.0 ttl pk-yrs)    Types: Cigarettes   Smokeless tobacco: Never  Vaping Use   Vaping status: Never Used  Substance and Sexual Activity   Alcohol use: Not Currently   Drug  use: Never   Sexual activity: Not on file  Other Topics Concern   Not on file  Social History Narrative   Not on file   Social Drivers of Health   Financial Resource Strain: Not on file  Food Insecurity: No Food Insecurity (04/27/2022)   Hunger Vital Sign    Worried About Running Out of Food in the Last Year: Never true    Ran Out of Food in the Last Year: Never true  Transportation Needs: No Transportation Needs (04/27/2022)   PRAPARE - Administrator, Civil Service (Medical): No    Lack of Transportation (Non-Medical): No  Physical Activity: Not on file  Stress: Not on file  Social Connections: Not on file  Intimate Partner Violence: Not At Risk (04/27/2022)   Humiliation, Afraid, Rape, and Kick questionnaire    Fear of Current or Ex-Partner: No    Emotionally Abused: No    Physically Abused: No    Sexually Abused: No    Lab Results  Component Value Date   HGBA1C 8.0 (A) 11/14/2022   No results found for: "CHOL" No results found for: "HDL" No results found for: "LDLCALC" No results found for: "TRIG" No results found for: "CHOLHDL" Lab Results  Component Value Date   CREATININE 0.94 04/27/2022   No results found for: "GFR" No results found for: "MICROALBUR", "MALB24HUR"    Component Value Date/Time   NA 140 04/27/2022 0938   K 4.2 04/27/2022 0938   CL 107 04/27/2022 0938   CO2 24 04/27/2022 0938   GLUCOSE 173 (H) 04/27/2022 0938   BUN 19 04/27/2022 0938   CREATININE 0.94 04/27/2022 0938   CALCIUM 9.2 04/27/2022 0938   PROT 7.3 04/27/2022 0938   ALBUMIN 4.1 04/27/2022 0938   AST 24 04/27/2022 0938   ALT 25 04/27/2022 0938   ALKPHOS 95 04/27/2022 0938   BILITOT 0.6 04/27/2022 0938   GFRNONAA >60 04/27/2022 0938      Latest Ref Rng & Units 04/27/2022    9:38 AM  BMP  Glucose 70 - 99 mg/dL 284   BUN 8 - 23 mg/dL 19   Creatinine 1.32 - 1.24 mg/dL 4.40   Sodium 102 - 725 mmol/L 140   Potassium  3.5 - 5.1 mmol/L 4.2   Chloride 98 - 111 mmol/L 107    CO2 22 - 32 mmol/L 24   Calcium 8.9 - 10.3 mg/dL 9.2        Component Value Date/Time   WBC 7.6 04/27/2022 0938   RBC 4.21 (L) 05/25/2022 1007   RBC 4.32 04/27/2022 0938   HGB 9.2 (L) 04/27/2022 0938   HCT 32.7 (L) 04/27/2022 0938   PLT 320 04/27/2022 0938   MCV 75.7 (L) 04/27/2022 0938   MCH 21.3 (L) 04/27/2022 0938   MCHC 28.1 (L) 04/27/2022 0938   RDW 16.7 (H) 04/27/2022 0938   LYMPHSABS 1.8 04/27/2022 0938   MONOABS 0.6 04/27/2022 0938   EOSABS 0.3 04/27/2022 0938   BASOSABS 0.1 04/27/2022 0938     Parts of this note may have been dictated using voice recognition software. There may be variances in spelling and vocabulary which are unintentional. Not all errors are proofread. Please notify the [facility] if any discrepancies are noted or if the meaning of any statement is not clear.

## 2023-03-15 ENCOUNTER — Telehealth: Payer: Self-pay

## 2023-03-15 ENCOUNTER — Encounter: Payer: Self-pay | Admitting: "Endocrinology

## 2023-03-15 NOTE — Telephone Encounter (Signed)
 After reviewing chart and speaking to patient, the patient passed out on 02/11/23. Per the patient he thinks his dosage taken prior to the meal he was consuming was too high for him and glucose dropped fast. On 2/17 office called patient to reschedule due to MD being out and staff was told that patient passed out and 911 was called. Patient stated he did not have his alarms on because they "go off in places I don't want them to." Explained to patient the purpose of the alarms and patient stated that now he has an understanding and that at his most recent office visit in office MD turned alarms back on. Also on 2/17 the MD on call gave recommendations to patient based on the information given by CMA as patient was still concerned about his blood glucose falling too low as it did a week prior. RN also advised patient the importance of checking the blood glucose levels if he is feeling symptomatic. Patient stated an understanding and states he generally keeps up with it. After reading CMA note dated 3/5 it seems as if the recommendation may not have been followed to decrease the insulin dosages. Patient also noted that he wanted an easier way to reach MD. RN gave patient Amber/CMA direct phone number and the name of the CMA. Patient stated he was writing it down, and stated he needed to reschedule an appointment he put down at last visit due to going to be out of town, patient thanked Charity fundraiser for call and number. RN transferred patient to front desk to reschedule appointment that was more suitable.

## 2023-03-16 ENCOUNTER — Ambulatory Visit: Admitting: "Endocrinology

## 2023-04-10 DIAGNOSIS — D225 Melanocytic nevi of trunk: Secondary | ICD-10-CM | POA: Diagnosis not present

## 2023-04-10 DIAGNOSIS — D485 Neoplasm of uncertain behavior of skin: Secondary | ICD-10-CM | POA: Diagnosis not present

## 2023-04-10 DIAGNOSIS — L570 Actinic keratosis: Secondary | ICD-10-CM | POA: Diagnosis not present

## 2023-04-10 DIAGNOSIS — L821 Other seborrheic keratosis: Secondary | ICD-10-CM | POA: Diagnosis not present

## 2023-04-10 DIAGNOSIS — L814 Other melanin hyperpigmentation: Secondary | ICD-10-CM | POA: Diagnosis not present

## 2023-04-10 DIAGNOSIS — D2239 Melanocytic nevi of other parts of face: Secondary | ICD-10-CM | POA: Diagnosis not present

## 2023-04-27 ENCOUNTER — Ambulatory Visit: Admitting: "Endocrinology

## 2023-05-11 ENCOUNTER — Encounter: Payer: Self-pay | Admitting: "Endocrinology

## 2023-05-11 ENCOUNTER — Ambulatory Visit: Admitting: "Endocrinology

## 2023-05-11 VITALS — BP 122/70 | HR 87 | Ht 70.0 in | Wt 167.0 lb

## 2023-05-11 DIAGNOSIS — E1165 Type 2 diabetes mellitus with hyperglycemia: Secondary | ICD-10-CM

## 2023-05-11 DIAGNOSIS — Z7984 Long term (current) use of oral hypoglycemic drugs: Secondary | ICD-10-CM

## 2023-05-11 DIAGNOSIS — Z794 Long term (current) use of insulin: Secondary | ICD-10-CM | POA: Diagnosis not present

## 2023-05-11 DIAGNOSIS — E7800 Pure hypercholesterolemia, unspecified: Secondary | ICD-10-CM

## 2023-05-11 LAB — POCT GLYCOSYLATED HEMOGLOBIN (HGB A1C): Hemoglobin A1C: 8.2 % — AB (ref 4.0–5.6)

## 2023-05-11 NOTE — Progress Notes (Signed)
 Outpatient Endocrinology Note Bobby Newcomer, MD  05/11/23   Milburn Huseby Osmun 29-May-1955 [id number]  Referring Provider: Adrian Hopper, MD Primary Care Provider: Adrian Hopper, MD Reason for consultation: Subjective   Assessment & Plan  Diagnoses and all orders for this visit:  Uncontrolled type 2 diabetes mellitus with hyperglycemia (HCC) -     POCT glycosylated hemoglobin (Hb A1C)   Diabetes complicated by neuropathy  Hba1c goal less than 7.0, current Hba1c is 8.2. 2025 Has a severe low episode with loss of consciousness. Did not remember Baqsimi . Had turned all alarms off in [place].  Turned the alarms  back on for patient, patient likes high BG/afraid of lows.   Will recommend for the following change of medications to: Tresiba  60 units once every morning Humalog  8 units before break fast, 8 units before lunch, and 12 before supper -15 min before meals  Farxiga 10 mg once a day Re-discussed use of Baqsimi  Turned back all alarms on [place] after patients agreement and adjustment on parameters   Not interested in insulin  pump No known contraindications to any of above medications Ordered Baqsimi  on 12/14/22 and explained its use    Hyperlipidemia -Last LDL near goal: 83 -on pravastatin 80 mg QD -Follow low fat diet and exercise   -Blood pressure goal <140/90 - Microalbumin/creatinine not done, ordered it -On lisinopril 10 mg qd -diet changes including salt restriction -limit eating outside -counseled BP targets per standards of diabetes care -Uncontrolled blood pressure can lead to retinopathy, nephropathy and cardiovascular and atherosclerotic heart disease  Reviewed and counseled on: -A1C target -Blood sugar targets -Complications of uncontrolled diabetes  -Checking blood sugar before meals and bedtime and bring log next visit -All medications with mechanism of action and side effects -Hypoglycemia management: rule of 15's, Glucagon  Emergency  Kit and medical alert ID -low-carb low-fat plate-method diet -At least 20 minutes of physical activity per day -Annual dilated retinal eye exam and foot exam -compliance and follow up needs -follow up as scheduled or earlier if problem gets worse  Call if blood sugar is less than 70 or consistently above 250    Take a 15 gm snack of carbohydrate at bedtime before you go to sleep if your blood sugar is less than 100.    If you are going to fast after midnight for a test or procedure, ask your physician for instructions on how to reduce/decrease your insulin  dose.    Call if blood sugar is less than 70 or consistently above 250  -Treating a low sugar by rule of 15  (15 gms of sugar every 15 min until sugar is more than 70) If you feel your sugar is low, test your sugar to be sure If your sugar is low (less than 70), then take 15 grams of a fast acting Carbohydrate (3-4 glucose tablets or glucose gel or 4 ounces of juice or regular soda) Recheck your sugar 15 min after treating low to make sure it is more than 70 If sugar is still less than 70, treat again with 15 grams of carbohydrate          Don't drive the hour of hypoglycemia  If unconscious/unable to eat or drink by mouth, use glucagon  injection or nasal spray baqsimi  and call 911. Can repeat again in 15 min if still unconscious.  Return in about 4 weeks (around 06/08/2023).   I have reviewed current medications, nurse's notes, allergies, vital signs, past medical and surgical history,  family medical history, and social history for this encounter. Counseled patient on symptoms, examination findings, lab findings, imaging results, treatment decisions and monitoring and prognosis. The patient understood the recommendations and agrees with the treatment plan. All questions regarding treatment plan were fully answered.  Bobby Newcomer, MD  05/11/23   History of Present Illness Bobby Downs is a 68 y.o. year old male who presents  for follow up of Type 2 diabetes mellitus.  Kasean K Yaeger was first diagnosed around 2007.   Diabetes education +  Home diabetes regimen: Tresiba  60 units once every morning Humalog  12 units before break fast, 8 before lunch, and 10 before supper -15 min before meals  Farxiga 10 mg once a day  COMPLICATIONS + Stroke -  retinopathy, last eye exam 2023 + neuropathy -  nephropathy  Labs reviewed  06/22/21 GFR 97 Cr 0.9 AST 12 ALT 18 Tg 85 HDL 51 VLDL 16 LDL 71  BLOOD SUGAR DATA  CGM interpretation: At today's visit, we reviewed her CGM downloads. The full report is scanned in the media. Reviewing the CGM trends, BG are elevated overnight with some lows after break fast and lunch.   Physical Exam  BP 122/70   Pulse 87   Ht 5\' 10"  (1.778 m)   Wt 167 lb (75.8 kg)   SpO2 97%   BMI 23.96 kg/m    Constitutional: well developed, well nourished Head: normocephalic, atraumatic Eyes: sclera anicteric, no redness Neck: supple Lungs: normal respiratory effort Neurology: alert and oriented Skin: dry, no appreciable rashes Musculoskeletal: no appreciable defects Psychiatric: normal mood and affect    Current Medications Patient's Medications  New Prescriptions   No medications on file  Previous Medications   ASPIRIN EC 81 MG TABLET    Take 81 mg by mouth daily. Swallow whole.   CLOPIDOGREL (PLAVIX) 75 MG TABLET    Take 75 mg by mouth daily.   CONTINUOUS GLUCOSE SENSOR (FREESTYLE LIBRE 3 SENSOR) MISC    1 Device by Does not apply route continuous.   FARXIGA 10 MG TABS TABLET    Take 10 mg by mouth daily.   FERROUS FUMARATE (IRON) 18 MG TBCR    Take 18 mg by mouth daily.   GLUCAGON  (BAQSIMI  ONE PACK) 3 MG/DOSE POWD    Place 1 Device into the nose as needed (Low blood sugar with impaired consciousness).   INSULIN  DEGLUDEC (TRESIBA  FLEXTOUCH) 200 UNIT/ML FLEXTOUCH PEN    Inject 64 Units into the skin daily at 6 (six) AM.   INSULIN  LISPRO (HUMALOG  KWIKPEN) 100 UNIT/ML  KWIKPEN    Inject 10 Units into the skin 3 (three) times daily.   LATANOPROST (XALATAN) 0.005 % OPHTHALMIC SOLUTION    1 drop at bedtime.   LISINOPRIL (ZESTRIL) 10 MG TABLET    Take 1 tablet by mouth daily.   MULTIPLE VITAMIN (MULTIVITAMIN) CAPSULE    Take 1 capsule by mouth daily.   PRAVASTATIN (PRAVACHOL) 80 MG TABLET    Take 80 mg by mouth daily.   TIMOLOL (TIMOPTIC) 0.5 % OPHTHALMIC SOLUTION    1 drop daily.  Modified Medications   No medications on file  Discontinued Medications   No medications on file    Allergies No Known Allergies  Past Medical History Past Medical History:  Diagnosis Date   Diabetes mellitus without complication (HCC)    Diabetic retinopathy (HCC)    Glaucoma    History of CVA (cerebrovascular accident)    History of melanoma  History of tobacco use    Hypertension    Insulin  dependent type 2 diabetes mellitus (HCC)     Past Surgical History Past Surgical History:  Procedure Laterality Date   DEEP NECK LYMPH NODE BIOPSY / EXCISION      Family History family history includes Brain cancer (age of onset: 73) in his brother; Breast cancer in his mother; Breast cancer (age of onset: 36) in his sister; Cancer in his maternal grandfather; Colon cancer (age of onset: 80) in his mother; Diabetes type II in his mother; Hearing loss in his sister; Hyperlipidemia in his mother; Hypertension in his mother; Seizures in his brother.  Social History Social History   Socioeconomic History   Marital status: Married    Spouse name: Not on file   Number of children: Not on file   Years of education: Not on file   Highest education level: Not on file  Occupational History   Not on file  Tobacco Use   Smoking status: Every Day    Current packs/day: 1.00    Average packs/day: 1 pack/day for 40.0 years (40.0 ttl pk-yrs)    Types: Cigarettes   Smokeless tobacco: Never  Vaping Use   Vaping status: Never Used  Substance and Sexual Activity   Alcohol use: Not  Currently   Drug use: Never   Sexual activity: Not on file  Other Topics Concern   Not on file  Social History Narrative   Not on file   Social Drivers of Health   Financial Resource Strain: Not on file  Food Insecurity: No Food Insecurity (04/27/2022)   Hunger Vital Sign    Worried About Running Out of Food in the Last Year: Never true    Ran Out of Food in the Last Year: Never true  Transportation Needs: No Transportation Needs (04/27/2022)   PRAPARE - Administrator, Civil Service (Medical): No    Lack of Transportation (Non-Medical): No  Physical Activity: Not on file  Stress: Not on file  Social Connections: Not on file  Intimate Partner Violence: Not At Risk (04/27/2022)   Humiliation, Afraid, Rape, and Kick questionnaire    Fear of Current or Ex-Partner: No    Emotionally Abused: No    Physically Abused: No    Sexually Abused: No    Lab Results  Component Value Date   HGBA1C 8.2 (A) 05/11/2023   No results found for: "CHOL" No results found for: "HDL" No results found for: "LDLCALC" No results found for: "TRIG" No results found for: "CHOLHDL" Lab Results  Component Value Date   CREATININE 0.94 04/27/2022   No results found for: "GFR" No results found for: "MICROALBUR", "MALB24HUR"    Component Value Date/Time   NA 140 04/27/2022 0938   K 4.2 04/27/2022 0938   CL 107 04/27/2022 0938   CO2 24 04/27/2022 0938   GLUCOSE 173 (H) 04/27/2022 0938   BUN 19 04/27/2022 0938   CREATININE 0.94 04/27/2022 0938   CALCIUM 9.2 04/27/2022 0938   PROT 7.3 04/27/2022 0938   ALBUMIN 4.1 04/27/2022 0938   AST 24 04/27/2022 0938   ALT 25 04/27/2022 0938   ALKPHOS 95 04/27/2022 0938   BILITOT 0.6 04/27/2022 0938   GFRNONAA >60 04/27/2022 0938      Latest Ref Rng & Units 04/27/2022    9:38 AM  BMP  Glucose 70 - 99 mg/dL 213   BUN 8 - 23 mg/dL 19   Creatinine 0.86 - 1.24 mg/dL 5.78  Sodium 135 - 145 mmol/L 140   Potassium 3.5 - 5.1 mmol/L 4.2   Chloride  98 - 111 mmol/L 107   CO2 22 - 32 mmol/L 24   Calcium 8.9 - 10.3 mg/dL 9.2        Component Value Date/Time   WBC 7.6 04/27/2022 0938   RBC 4.21 (L) 05/25/2022 1007   RBC 4.32 04/27/2022 0938   HGB 9.2 (L) 04/27/2022 0938   HCT 32.7 (L) 04/27/2022 0938   PLT 320 04/27/2022 0938   MCV 75.7 (L) 04/27/2022 0938   MCH 21.3 (L) 04/27/2022 0938   MCHC 28.1 (L) 04/27/2022 0938   RDW 16.7 (H) 04/27/2022 0938   LYMPHSABS 1.8 04/27/2022 0938   MONOABS 0.6 04/27/2022 0938   EOSABS 0.3 04/27/2022 0938   BASOSABS 0.1 04/27/2022 0938     Parts of this note may have been dictated using voice recognition software. There may be variances in spelling and vocabulary which are unintentional. Not all errors are proofread. Please notify the [facility] if any discrepancies are noted or if the meaning of any statement is not clear.

## 2023-05-11 NOTE — Patient Instructions (Addendum)
 Will recommend for the following change of medications to: Tresiba  60 units once every morning Humalog  8 units before break fast, 8 units before lunch, and 12 before supper -15 min before meals  Farxiga 10 mg once a day

## 2023-05-17 ENCOUNTER — Telehealth: Payer: Self-pay

## 2023-05-17 NOTE — Telephone Encounter (Signed)
 Error

## 2023-05-31 DIAGNOSIS — H401134 Primary open-angle glaucoma, bilateral, indeterminate stage: Secondary | ICD-10-CM | POA: Diagnosis not present

## 2023-06-06 DIAGNOSIS — E782 Mixed hyperlipidemia: Secondary | ICD-10-CM | POA: Diagnosis not present

## 2023-06-06 DIAGNOSIS — Z6824 Body mass index (BMI) 24.0-24.9, adult: Secondary | ICD-10-CM | POA: Diagnosis not present

## 2023-06-06 DIAGNOSIS — I10 Essential (primary) hypertension: Secondary | ICD-10-CM | POA: Diagnosis not present

## 2023-06-06 DIAGNOSIS — E113293 Type 2 diabetes mellitus with mild nonproliferative diabetic retinopathy without macular edema, bilateral: Secondary | ICD-10-CM | POA: Diagnosis not present

## 2023-06-06 DIAGNOSIS — Z85820 Personal history of malignant melanoma of skin: Secondary | ICD-10-CM | POA: Diagnosis not present

## 2023-06-06 DIAGNOSIS — Z794 Long term (current) use of insulin: Secondary | ICD-10-CM | POA: Diagnosis not present

## 2023-06-06 DIAGNOSIS — Z8673 Personal history of transient ischemic attack (TIA), and cerebral infarction without residual deficits: Secondary | ICD-10-CM | POA: Diagnosis not present

## 2023-06-12 ENCOUNTER — Ambulatory Visit: Admitting: "Endocrinology

## 2023-06-12 ENCOUNTER — Encounter: Payer: Self-pay | Admitting: "Endocrinology

## 2023-06-12 VITALS — BP 132/80 | HR 84 | Ht 70.0 in | Wt 168.0 lb

## 2023-06-12 DIAGNOSIS — E7800 Pure hypercholesterolemia, unspecified: Secondary | ICD-10-CM | POA: Diagnosis not present

## 2023-06-12 DIAGNOSIS — Z794 Long term (current) use of insulin: Secondary | ICD-10-CM | POA: Diagnosis not present

## 2023-06-12 DIAGNOSIS — E1165 Type 2 diabetes mellitus with hyperglycemia: Secondary | ICD-10-CM | POA: Diagnosis not present

## 2023-06-12 DIAGNOSIS — Z7984 Long term (current) use of oral hypoglycemic drugs: Secondary | ICD-10-CM

## 2023-06-12 MED ORDER — FREESTYLE LIBRE 3 PLUS SENSOR MISC: 3 refills | Status: DC

## 2023-06-12 NOTE — Progress Notes (Signed)
 Outpatient Endocrinology Note Bobby Newcomer, MD  06/12/23   Bobby Downs 04-24-1955 [id number]  Referring Provider: Adrian Hopper, MD Primary Care Provider: Adrian Hopper, MD Reason for consultation: Subjective   Assessment & Plan  Diagnoses and all orders for this visit:  Uncontrolled type 2 diabetes mellitus with hyperglycemia (HCC) -     Comprehensive metabolic panel with GFR -     Lipid panel -     Microalbumin / creatinine urine ratio  Long-term insulin  use (HCC)  Long term (current) use of oral hypoglycemic drugs  Pure hypercholesterolemia  Other orders -     Continuous Glucose Sensor (FREESTYLE LIBRE 3 PLUS SENSOR) MISC; Change sensor every 15 days.  Diabetes complicated by neuropathy  Hba1c goal less than 7.0, current Hba1c is 8.2. 2025 Has a severe low episode with loss of consciousness. Did not remember Baqsimi . Had turned all alarms off in [place].  Not fixes lows of 60s with orange juice leading to 400s Also c/o price for all medications but wants to continue all   Not able to chase his labs with PCP as patient is not able to recall PCP name Very difficult patient to work with  Turned the alarms back on for patient, patient likes high BG/afraid of lows.   Will recommend for the following change of medications to: Farxiga 10 mg once a day Tresiba  65-70 units once every morning Humalog  correction scale: Use 15 min before meals based on blood sugars as follows: 151 - 175: 1 unit 176 - 200: 2 units 201 - 225: 3 units 226 - 250: 4 units 251 - 275: 5 units 276 - 300: 6 units 301 - 325: 7 units 326 - 350: 8 units 351 - 375: 9 units 376 - 400: 10 units   Stop Humalog  9 units before break fast, 8 units before lunch, and 12 before supper -15 min before meals  Re-discussed use of Baqsimi  Turned back all alarms on libre after patients agreement and adjustment on parameters   Not interested in insulin  pump No known contraindications to any  of above medications Ordered Baqsimi  on 12/14/22 and explained its use    Hyperlipidemia -Last LDL near goal: 83 -on pravastatin 80 mg QD -Follow low fat diet and exercise   -Blood pressure goal <140/90 - Microalbumin/creatinine not done, ordered it -On lisinopril 10 mg qd -diet changes including salt restriction -limit eating outside -counseled BP targets per standards of diabetes care -Uncontrolled blood pressure can lead to retinopathy, nephropathy and cardiovascular and atherosclerotic heart disease  Reviewed and counseled on: -A1C target -Blood sugar targets -Complications of uncontrolled diabetes  -Checking blood sugar before meals and bedtime and bring log next visit -All medications with mechanism of action and side effects -Hypoglycemia management: rule of 15's, Glucagon  Emergency Kit and medical alert ID -low-carb low-fat plate-method diet -At least 20 minutes of physical activity per day -Annual dilated retinal eye exam and foot exam -compliance and follow up needs -follow up as scheduled or earlier if problem gets worse  Call if blood sugar is less than 70 or consistently above 250    Take a 15 gm snack of carbohydrate at bedtime before you go to sleep if your blood sugar is less than 100.    If you are going to fast after midnight for a test or procedure, ask your physician for instructions on how to reduce/decrease your insulin  dose.    Call if blood sugar is less than 70  or consistently above 250  -Treating a low sugar by rule of 15  (15 gms of sugar every 15 min until sugar is more than 70) If you feel your sugar is low, test your sugar to be sure If your sugar is low (less than 70), then take 15 grams of a fast acting Carbohydrate (3-4 glucose tablets or glucose gel or 4 ounces of juice or regular soda) Recheck your sugar 15 min after treating low to make sure it is more than 70 If sugar is still less than 70, treat again with 15 grams of carbohydrate           Don't drive the hour of hypoglycemia  If unconscious/unable to eat or drink by mouth, use glucagon  injection or nasal spray baqsimi  and call 911. Can repeat again in 15 min if still unconscious.  Return in about 8 days (around 06/20/2023) for visit and 8 am labs before next visit.   I have reviewed current medications, nurse's notes, allergies, vital signs, past medical and surgical history, family medical history, and social history for this encounter. Counseled patient on symptoms, examination findings, lab findings, imaging results, treatment decisions and monitoring and prognosis. The patient understood the recommendations and agrees with the treatment plan. All questions regarding treatment plan were fully answered.  Bobby Newcomer, MD  06/12/23   History of Present Illness Bobby Downs is a 68 y.o. year old male who presents for follow up of Type 2 diabetes mellitus.  Bobby Downs was first diagnosed around 2007.   Diabetes education +  Home diabetes regimen: Tresiba  60 units once every morning Humalog  8 units before break fast, 8 before lunch, and 10-14 before supper -15 min before meals  Farxiga 10 mg once a day  COMPLICATIONS + Stroke -  retinopathy, last eye exam 2023 + neuropathy -  nephropathy  Labs reviewed 06/22/21 GFR 97 Cr 0.9 AST 12 ALT 18 Tg 85 HDL 51 VLDL 16 LDL 71  BLOOD SUGAR DATA  CGM interpretation: At today's visit, we reviewed her CGM downloads. The full report is scanned in the media. Reviewing the CGM trends, BG are elevated overnight with some lows after lunch/dinner.    Physical Exam  BP 132/80   Pulse 84   Ht 5\' 10"  (1.778 m)   Wt 168 lb (76.2 kg)   SpO2 98%   BMI 24.11 kg/m    Constitutional: well developed, well nourished Head: normocephalic, atraumatic Eyes: sclera anicteric, no redness Neck: supple Lungs: normal respiratory effort Neurology: alert and oriented Skin: dry, no appreciable rashes Musculoskeletal: no  appreciable defects Psychiatric: normal mood and affect    Current Medications Patient's Medications  New Prescriptions   CONTINUOUS GLUCOSE SENSOR (FREESTYLE LIBRE 3 PLUS SENSOR) MISC    Change sensor every 15 days.  Previous Medications   ASPIRIN EC 81 MG TABLET    Take 81 mg by mouth daily. Swallow whole.   CLOPIDOGREL (PLAVIX) 75 MG TABLET    Take 75 mg by mouth daily.   FARXIGA 10 MG TABS TABLET    Take 10 mg by mouth daily.   FERROUS FUMARATE (IRON) 18 MG TBCR    Take 18 mg by mouth daily.   GLUCAGON  (BAQSIMI  ONE PACK) 3 MG/DOSE POWD    Place 1 Device into the nose as needed (Low blood sugar with impaired consciousness).   INSULIN  LISPRO (HUMALOG  KWIKPEN) 100 UNIT/ML KWIKPEN    Inject 10 Units into the skin 3 (three) times daily.  LATANOPROST (XALATAN) 0.005 % OPHTHALMIC SOLUTION    1 drop at bedtime.   LISINOPRIL (ZESTRIL) 10 MG TABLET    Take 1 tablet by mouth daily.   MULTIPLE VITAMIN (MULTIVITAMIN) CAPSULE    Take 1 capsule by mouth daily.   PRAVASTATIN (PRAVACHOL) 80 MG TABLET    Take 80 mg by mouth daily.   TIMOLOL (TIMOPTIC) 0.5 % OPHTHALMIC SOLUTION    1 drop daily.  Modified Medications   No medications on file  Discontinued Medications   CONTINUOUS GLUCOSE SENSOR (FREESTYLE LIBRE 3 SENSOR) MISC    1 Device by Does not apply route continuous.    Allergies No Known Allergies  Past Medical History Past Medical History:  Diagnosis Date   Diabetes mellitus without complication (HCC)    Diabetic retinopathy (HCC)    Glaucoma    History of CVA (cerebrovascular accident)    History of melanoma    History of tobacco use    Hypertension    Insulin  dependent type 2 diabetes mellitus (HCC)     Past Surgical History Past Surgical History:  Procedure Laterality Date   DEEP NECK LYMPH NODE BIOPSY / EXCISION      Family History family history includes Brain cancer (age of onset: 77) in his brother; Breast cancer in his mother; Breast cancer (age of onset: 63) in his  sister; Cancer in his maternal grandfather; Colon cancer (age of onset: 44) in his mother; Diabetes type II in his mother; Hearing loss in his sister; Hyperlipidemia in his mother; Hypertension in his mother; Seizures in his brother.  Social History Social History   Socioeconomic History   Marital status: Married    Spouse name: Not on file   Number of children: Not on file   Years of education: Not on file   Highest education level: Not on file  Occupational History   Not on file  Tobacco Use   Smoking status: Every Day    Current packs/day: 1.00    Average packs/day: 1 pack/day for 40.0 years (40.0 ttl pk-yrs)    Types: Cigarettes   Smokeless tobacco: Never  Vaping Use   Vaping status: Never Used  Substance and Sexual Activity   Alcohol use: Not Currently   Drug use: Never   Sexual activity: Not on file  Other Topics Concern   Not on file  Social History Narrative   Not on file   Social Drivers of Health   Financial Resource Strain: Not on file  Food Insecurity: No Food Insecurity (04/27/2022)   Hunger Vital Sign    Worried About Running Out of Food in the Last Year: Never true    Ran Out of Food in the Last Year: Never true  Transportation Needs: No Transportation Needs (04/27/2022)   PRAPARE - Administrator, Civil Service (Medical): No    Lack of Transportation (Non-Medical): No  Physical Activity: Not on file  Stress: Not on file  Social Connections: Not on file  Intimate Partner Violence: Not At Risk (04/27/2022)   Humiliation, Afraid, Rape, and Kick questionnaire    Fear of Current or Ex-Partner: No    Emotionally Abused: No    Physically Abused: No    Sexually Abused: No    Lab Results  Component Value Date   HGBA1C 8.2 (A) 05/11/2023   No results found for: "CHOL" No results found for: "HDL" No results found for: "LDLCALC" No results found for: "TRIG" No results found for: "CHOLHDL" Lab Results  Component  Value Date   CREATININE 0.94  04/27/2022   No results found for: "GFR" No results found for: "MICROALBUR", "[id number]"    Component Value Date/Time   NA 140 04/27/2022 0938   K 4.2 04/27/2022 0938   CL 107 04/27/2022 0938   CO2 24 04/27/2022 0938   GLUCOSE 173 (H) 04/27/2022 0938   BUN 19 04/27/2022 0938   CREATININE 0.94 04/27/2022 0938   CALCIUM 9.2 04/27/2022 0938   PROT 7.3 04/27/2022 0938   ALBUMIN 4.1 04/27/2022 0938   AST 24 04/27/2022 0938   ALT 25 04/27/2022 0938   ALKPHOS 95 04/27/2022 0938   BILITOT 0.6 04/27/2022 0938   GFRNONAA >60 04/27/2022 0938      Latest Ref Rng & Units 04/27/2022    9:38 AM  BMP  Glucose 70 - 99 mg/dL 161   BUN 8 - 23 mg/dL 19   Creatinine 0.96 - 1.24 mg/dL 0.45   Sodium 409 - 811 mmol/L 140   Potassium 3.5 - 5.1 mmol/L 4.2   Chloride 98 - 111 mmol/L 107   CO2 22 - 32 mmol/L 24   Calcium 8.9 - 10.3 mg/dL 9.2        Component Value Date/Time   WBC 7.6 04/27/2022 0938   RBC 4.21 (L) 05/25/2022 1007   RBC 4.32 04/27/2022 0938   HGB 9.2 (L) 04/27/2022 0938   HCT 32.7 (L) 04/27/2022 0938   PLT 320 04/27/2022 0938   MCV 75.7 (L) 04/27/2022 0938   MCH 21.3 (L) 04/27/2022 0938   MCHC 28.1 (L) 04/27/2022 0938   RDW 16.7 (H) 04/27/2022 0938   LYMPHSABS 1.8 04/27/2022 0938   MONOABS 0.6 04/27/2022 0938   EOSABS 0.3 04/27/2022 0938   BASOSABS 0.1 04/27/2022 0938     Parts of this note may have been dictated using voice recognition software. There may be variances in spelling and vocabulary which are unintentional. Not all errors are proofread. Please notify the [facility] if any discrepancies are noted or if the meaning of any statement is not clear.

## 2023-06-12 NOTE — Patient Instructions (Addendum)
 Will recommend for the following change of medications to: Farxiga 10 mg once a day Tresiba  65-70 units once every morning Humalog  correction scale: three times a day 15 min before each meal based on blood sugars as follows: 151 - 175: 1 unit 176 - 200: 2 units 201 - 225: 3 units 226 - 250: 4 units 251 - 275: 5 units 276 - 300: 6 units 301 - 325: 7 units 326 - 350: 8 units 351 - 375: 9 units 376 - 400: 10 units  _________    Goals of DM therapy:  Morning Fasting blood sugar: 80-140  Blood sugar before meals: 80-140 Bed time blood sugar: 100-150  A1C <7%, limited only by hypoglycemia  1.Diabetes medications and their side effects discussed, including hypoglycemia    2. Check blood glucose:  a) Always check blood sugars before driving. Please see below (under hypoglycemia) on how to manage b) Check a minimum of 3 times/day or more as needed when having symptoms of hypoglycemia.   c) Try to check blood glucose before sleeping/in the middle of the night to ensure that it is remaining stable and not dropping less than 100 d) Check blood glucose more often if sick  3. Diet: a) 3 meals per day schedule b: Restrict carbs to 60-70 grams (4 servings) per meal c) Colorful vegetables - 3 servings a day, and low sugar fruit 2 servings/day Plate control method: 1/4 plate protein, 1/4 starch, 1/2 green, yellow, or red vegetables d) Avoid carbohydrate snacks unless hypoglycemic episode, or increased physical activity  4. Regular exercise as tolerated, preferably 3 or more hours a week  5. Hypoglycemia: a)  Do not drive or operate machinery without first testing blood glucose to assure it is over 90 mg%, or if dizzy, lightheaded, not feeling normal, etc, or  if foot or leg is numb or weak. b)  If blood glucose less than 70, take four 5gm Glucose tabs or 15-30 gm Glucose gel.  Repeat every 15 min as needed until blood sugar is >100 mg/dl. If hypoglycemia persists then call 911.   6.  Sick day management: a) Check blood glucose more often b) Continue usual therapy if blood sugars are elevated.   7. Contact the doctor immediately if blood glucose is frequently <60 mg/dl, or an episode of severe hypoglycemia occurs (where someone had to give you glucose/  glucagon  or if you passed out from a low blood glucose), or if blood glucose is persistently >350 mg/dl, for further management  8. A change in level of physical activity or exercise and a change in diet may also affect your blood sugar. Check blood sugars more often and call if needed.  Instructions: 1. Bring glucose meter, blood glucose records on every visit for review 2. Continue to follow up with primary care physician and other providers for medical care 3. Yearly eye  and foot exam 4. Please get blood work done prior to the next appointment

## 2023-06-21 ENCOUNTER — Encounter: Payer: Self-pay | Admitting: "Endocrinology

## 2023-06-21 ENCOUNTER — Ambulatory Visit: Admitting: "Endocrinology

## 2023-06-21 VITALS — BP 100/64 | HR 79 | Ht 70.0 in

## 2023-06-21 DIAGNOSIS — E1165 Type 2 diabetes mellitus with hyperglycemia: Secondary | ICD-10-CM

## 2023-06-21 DIAGNOSIS — Z794 Long term (current) use of insulin: Secondary | ICD-10-CM | POA: Diagnosis not present

## 2023-06-21 DIAGNOSIS — E782 Mixed hyperlipidemia: Secondary | ICD-10-CM | POA: Diagnosis not present

## 2023-06-21 DIAGNOSIS — Z7984 Long term (current) use of oral hypoglycemic drugs: Secondary | ICD-10-CM | POA: Diagnosis not present

## 2023-06-21 NOTE — Patient Instructions (Addendum)
 Will recommend for the following change of medications to: Farxiga 10 mg once a day Tresiba  70 units once every morning Humalog  correction scale: 4 units with break fast, 2 units with lunch and 5 units with supper: 15 min before meals Humalog  Correction scale: Use in addition to your meal time/short acting insulin  based on blood sugars as follows: 151 - 190: 1 unit 191 - 230: 2 units 231 - 270: 3 units 271 - 310: 4 units 311 - 350: 5 units 351 - 390: 6 units 391 - 430: 7 units

## 2023-06-21 NOTE — Progress Notes (Signed)
 Outpatient Endocrinology Note Bobby Newcomer, MD  06/21/23   Bobby Downs 1955/05/27 [id number]  Referring Provider: Adrian Hopper, MD Primary Care Provider: Adrian Hopper, MD Reason for consultation: Subjective   Assessment & Plan  Diagnoses and all orders for this visit:  Uncontrolled type 2 diabetes mellitus with hyperglycemia (HCC)  Long-term insulin  use (HCC)  Long term (current) use of oral hypoglycemic drugs  Pure hypercholesterolemia   Diabetes complicated by neuropathy  Hba1c goal less than 7.0, current Hba1c is 8.2. Will recommend for the following change of medications to: Farxiga 10 mg once a day Tresiba  70 units once every morning Humalog : 4 units with break fast, 2 units with lunch and 5 units with supper: 15 min before meals Humalog  correction scale: Use in addition to your meal time/short acting insulin  based on blood sugars as follows:  151 - 190: 1 unit 191 - 230: 2 units 231 - 270: 3 units 271 - 310: 4 units 311 - 350: 5 units 351 - 390: 6 units 391 - 430: 7 units   Previously: On Humalog  9 units before break fast, 8 units before lunch, and 12 before supper -15 min before meals  2025 Has a severe low episode with loss of consciousness. Did not remember Baqsimi . Had turned all alarms off in [place].  Now fixes lows of 60s with orange juice leading to 400s Also c/o price for all medications but wants to continue all   Turned the alarms back on for patient, patient likes high BG/afraid of lows.   Re-discussed use of Baqsimi  Turned back all alarms on libre after patients agreement and adjustment on parameters   Not interested in insulin  pump No known contraindications to any of above medications Ordered Baqsimi  on 12/14/22 and explained its use    Hyperlipidemia -Last LDL near goal: 87 -on pravastatin 80 mg QD -Follow low fat diet and exercise   -Blood pressure goal <140/90 - Microalbumin/creatinine not done, ordered  it -On lisinopril 10 mg qd -diet changes including salt restriction -limit eating outside -counseled BP targets per standards of diabetes care -Uncontrolled blood pressure can lead to retinopathy, nephropathy and cardiovascular and atherosclerotic heart disease  Reviewed and counseled on: -A1C target -Blood sugar targets -Complications of uncontrolled diabetes  -Checking blood sugar before meals and bedtime and bring log next visit -All medications with mechanism of action and side effects -Hypoglycemia management: rule of 15's, Glucagon  Emergency Kit and medical alert ID -low-carb low-fat plate-method diet -At least 20 minutes of physical activity per day -Annual dilated retinal eye exam and foot exam -compliance and follow up needs -follow up as scheduled or earlier if problem gets worse  Call if blood sugar is less than 70 or consistently above 250    Take a 15 gm snack of carbohydrate at bedtime before you go to sleep if your blood sugar is less than 100.    If you are going to fast after midnight for a test or procedure, ask your physician for instructions on how to reduce/decrease your insulin  dose.    Call if blood sugar is less than 70 or consistently above 250  -Treating a low sugar by rule of 15  (15 gms of sugar every 15 min until sugar is more than 70) If you feel your sugar is low, test your sugar to be sure If your sugar is low (less than 70), then take 15 grams of a fast acting Carbohydrate (3-4 glucose tablets or glucose  gel or 4 ounces of juice or regular soda) Recheck your sugar 15 min after treating low to make sure it is more than 70 If sugar is still less than 70, treat again with 15 grams of carbohydrate          Don't drive the hour of hypoglycemia  If unconscious/unable to eat or drink by mouth, use glucagon  injection or nasal spray baqsimi  and call 911. Can repeat again in 15 min if still unconscious.  Return in about 4 weeks (around 07/19/2023).   I  have reviewed current medications, nurse's notes, allergies, vital signs, past medical and surgical history, family medical history, and social history for this encounter. Counseled patient on symptoms, examination findings, lab findings, imaging results, treatment decisions and monitoring and prognosis. The patient understood the recommendations and agrees with the treatment plan. All questions regarding treatment plan were fully answered.  Bobby Newcomer, MD  06/21/23   History of Present Illness Bobby Downs is a 68 y.o. year old male who presents for follow up of Type 2 diabetes mellitus.  Bobby Downs was first diagnosed around 2007.   Diabetes education +  Home diabetes regimen: Tresiba  66 units once every morning Humalog  8 units before break fast, 8 before lunch, and 10-14 before supper -15 min before meals  Farxiga 10 mg once a day  COMPLICATIONS + Stroke -  retinopathy, last eye exam 2023 + neuropathy -  nephropathy  Labs reviewed 06/06/23  GFR 80 AST 27 ALT 26 LDL 87 Tg 338 H HDL 40 Chol 182 VLDL 55 H  06/22/21 GFR 97 Cr 0.9 AST 12 ALT 18 Tg 85 HDL 51 VLDL 16 LDL 71  BLOOD SUGAR DATA CGM interpretation: At today's visit, we reviewed her CGM downloads. The full report is scanned in the media. Reviewing the CGM trends, BG are elevated after meals with some rare lows in evening.   Physical Exam  BP 100/64   Pulse 79   Ht 5' 10 (1.778 m)   SpO2 97%   BMI 24.11 kg/m    Constitutional: well developed, well nourished Head: normocephalic, atraumatic Eyes: sclera anicteric, no redness Neck: supple Lungs: normal respiratory effort Neurology: alert and oriented Skin: dry, no appreciable rashes Musculoskeletal: no appreciable defects Psychiatric: normal mood and affect Diabetic Foot Exam - Simple   Simple Foot Form Diabetic Foot exam was performed with the following findings: Yes 06/21/2023  2:40 PM  Visual Inspection No deformities, no  ulcerations, no other skin breakdown bilaterally: Yes Sensation Testing Intact to touch and monofilament testing bilaterally: Yes Pulse Check Posterior Tibialis and Dorsalis pulse intact bilaterally: Yes Comments       Current Medications Patient's Medications  New Prescriptions   No medications on file  Previous Medications   ASPIRIN EC 81 MG TABLET    Take 81 mg by mouth daily. Swallow whole.   CLOPIDOGREL (PLAVIX) 75 MG TABLET    Take 75 mg by mouth daily.   CONTINUOUS GLUCOSE SENSOR (FREESTYLE LIBRE 3 PLUS SENSOR) MISC    Change sensor every 15 days.   FARXIGA 10 MG TABS TABLET    Take 10 mg by mouth daily.   FERROUS FUMARATE (IRON) 18 MG TBCR    Take 18 mg by mouth daily.   GLUCAGON  (BAQSIMI  ONE PACK) 3 MG/DOSE POWD    Place 1 Device into the nose as needed (Low blood sugar with impaired consciousness).   INSULIN  LISPRO (HUMALOG  KWIKPEN) 100 UNIT/ML KWIKPEN    Inject 10  Units into the skin 3 (three) times daily.   LATANOPROST (XALATAN) 0.005 % OPHTHALMIC SOLUTION    1 drop at bedtime.   LISINOPRIL (ZESTRIL) 10 MG TABLET    Take 1 tablet by mouth daily.   MULTIPLE VITAMIN (MULTIVITAMIN) CAPSULE    Take 1 capsule by mouth daily.   PRAVASTATIN (PRAVACHOL) 80 MG TABLET    Take 80 mg by mouth daily.   TIMOLOL (TIMOPTIC) 0.5 % OPHTHALMIC SOLUTION    1 drop daily.  Modified Medications   No medications on file  Discontinued Medications   No medications on file    Allergies No Known Allergies  Past Medical History Past Medical History:  Diagnosis Date   Diabetes mellitus without complication (HCC)    Diabetic retinopathy (HCC)    Glaucoma    History of CVA (cerebrovascular accident)    History of melanoma    History of tobacco use    Hypertension    Insulin  dependent type 2 diabetes mellitus (HCC)     Past Surgical History Past Surgical History:  Procedure Laterality Date   DEEP NECK LYMPH NODE BIOPSY / EXCISION      Family History family history includes Brain  cancer (age of onset: 46) in his brother; Breast cancer in his mother; Breast cancer (age of onset: 78) in his sister; Cancer in his maternal grandfather; Colon cancer (age of onset: 81) in his mother; Diabetes type II in his mother; Hearing loss in his sister; Hyperlipidemia in his mother; Hypertension in his mother; Seizures in his brother.  Social History Social History   Socioeconomic History   Marital status: Married    Spouse name: Not on file   Number of children: Not on file   Years of education: Not on file   Highest education level: Not on file  Occupational History   Not on file  Tobacco Use   Smoking status: Every Day    Current packs/day: 1.00    Average packs/day: 1 pack/day for 40.0 years (40.0 ttl pk-yrs)    Types: Cigarettes   Smokeless tobacco: Never  Vaping Use   Vaping status: Never Used  Substance and Sexual Activity   Alcohol use: Not Currently   Drug use: Never   Sexual activity: Not on file  Other Topics Concern   Not on file  Social History Narrative   Not on file   Social Drivers of Health   Financial Resource Strain: Not on file  Food Insecurity: No Food Insecurity (04/27/2022)   Hunger Vital Sign    Worried About Running Out of Food in the Last Year: Never true    Ran Out of Food in the Last Year: Never true  Transportation Needs: No Transportation Needs (04/27/2022)   PRAPARE - Administrator, Civil Service (Medical): No    Lack of Transportation (Non-Medical): No  Physical Activity: Not on file  Stress: Not on file  Social Connections: Not on file  Intimate Partner Violence: Not At Risk (04/27/2022)   Humiliation, Afraid, Rape, and Kick questionnaire    Fear of Current or Ex-Partner: No    Emotionally Abused: No    Physically Abused: No    Sexually Abused: No    Lab Results  Component Value Date   HGBA1C 8.2 (A) 05/11/2023   No results found for: CHOL No results found for: HDL No results found for: LDLCALC No  results found for: TRIG No results found for: [facility] Lab Results  Component Value Date  CREATININE 0.94 04/27/2022   No results found for: GFR No results found for: Crist[facility] Donate    Component Value Date/Time   NA 140 04/27/2022 0938   K 4.2 04/27/2022 0938   CL 107 04/27/2022 0938   CO2 24 04/27/2022 0938   GLUCOSE 173 (H) 04/27/2022 0938   BUN 19 04/27/2022 0938   CREATININE 0.94 04/27/2022 0938   CALCIUM 9.2 04/27/2022 0938   PROT 7.3 04/27/2022 0938   ALBUMIN 4.1 04/27/2022 0938   AST 24 04/27/2022 0938   ALT 25 04/27/2022 0938   ALKPHOS 95 04/27/2022 0938   BILITOT 0.6 04/27/2022 0938   GFRNONAA >60 04/27/2022 0938      Latest Ref Rng & Units 04/27/2022    9:38 AM  BMP  Glucose 70 - 99 mg/dL 409   BUN 8 - 23 mg/dL 19   Creatinine 8.11 - 1.24 mg/dL 9.14   Sodium 782 - 956 mmol/L 140   Potassium 3.5 - 5.1 mmol/L 4.2   Chloride 98 - 111 mmol/L 107   CO2 22 - 32 mmol/L 24   Calcium 8.9 - 10.3 mg/dL 9.2        Component Value Date/Time   WBC 7.6 04/27/2022 0938   RBC 4.21 (L) 05/25/2022 1007   RBC 4.32 04/27/2022 0938   HGB 9.2 (L) 04/27/2022 0938   HCT 32.7 (L) 04/27/2022 0938   PLT 320 04/27/2022 0938   MCV 75.7 (L) 04/27/2022 0938   MCH 21.3 (L) 04/27/2022 0938   MCHC 28.1 (L) 04/27/2022 0938   RDW 16.7 (H) 04/27/2022 0938   LYMPHSABS 1.8 04/27/2022 0938   MONOABS 0.6 04/27/2022 0938   EOSABS 0.3 04/27/2022 0938   BASOSABS 0.1 04/27/2022 0938     Parts of this note may have been dictated using voice recognition software. There may be variances in spelling and vocabulary which are unintentional. Not all errors are proofread. Please notify the [facility] if any discrepancies are noted or if the meaning of any statement is not clear.

## 2023-07-19 DIAGNOSIS — E079 Disorder of thyroid, unspecified: Secondary | ICD-10-CM | POA: Diagnosis not present

## 2023-08-01 ENCOUNTER — Ambulatory Visit: Admitting: "Endocrinology

## 2023-08-01 ENCOUNTER — Encounter: Payer: Self-pay | Admitting: "Endocrinology

## 2023-08-01 ENCOUNTER — Telehealth: Payer: Self-pay

## 2023-08-01 VITALS — BP 104/80 | HR 75 | Ht 70.0 in | Wt 169.0 lb

## 2023-08-01 DIAGNOSIS — E1165 Type 2 diabetes mellitus with hyperglycemia: Secondary | ICD-10-CM

## 2023-08-01 DIAGNOSIS — E782 Mixed hyperlipidemia: Secondary | ICD-10-CM | POA: Diagnosis not present

## 2023-08-01 DIAGNOSIS — Z7984 Long term (current) use of oral hypoglycemic drugs: Secondary | ICD-10-CM

## 2023-08-01 DIAGNOSIS — E0590 Thyrotoxicosis, unspecified without thyrotoxic crisis or storm: Secondary | ICD-10-CM | POA: Diagnosis not present

## 2023-08-01 DIAGNOSIS — Z794 Long term (current) use of insulin: Secondary | ICD-10-CM

## 2023-08-01 MED ORDER — METHIMAZOLE 5 MG PO TABS: 5.0000 mg | ORAL_TABLET | Freq: Every day | ORAL | 0 refills | Status: DC

## 2023-08-01 MED ORDER — SITAGLIPTIN PHOSPHATE 100 MG PO TABS: 100.0000 mg | ORAL_TABLET | Freq: Every day | ORAL | 1 refills | Status: DC

## 2023-08-01 NOTE — Telephone Encounter (Signed)
 Spoke to pt regarding lab results and new medication for thyroid .

## 2023-08-01 NOTE — Patient Instructions (Signed)

## 2023-08-01 NOTE — Progress Notes (Signed)
 Outpatient Endocrinology Note Obadiah Birmingham, MD  08/01/23   Bobby Downs [birth year] [id number]  Referring Provider: Keren Vicenta BRAVO, MD Primary Care Provider: Keren Vicenta BRAVO, MD Reason for consultation: Subjective   Assessment & Plan  Diagnoses and all orders for this visit:  Uncontrolled type 2 diabetes mellitus with hyperglycemia (HCC) -     Cancel: Lipid panel; Future -     Lipid panel  Long-term insulin  use (HCC)  Long term (current) use of oral hypoglycemic drugs  Mixed hypercholesterolemia and hypertriglyceridemia -     Cancel: Lipid panel; Future -     Lipid panel  Hyperthyroidism -     TSH; Future -     T4, free; Future -     T3, free; Future -     Thyroid  stimulating immunoglobulin; Future -     Thyrotropin receptor autoabs; Future -     CBC; Future  Other orders -     sitaGLIPtin  (JANUVIA ) 100 MG tablet; Take 1 tablet (100 mg total) by mouth daily. -     methimazole  (TAPAZOLE ) 5 MG tablet; Take 1 tablet (5 mg total) by mouth daily.   Diabetes complicated by neuropathy  Hba1c goal less than 7.0, current Hba1c is 8.2. Will recommend for the following change of medications to: Farxiga 10 mg once a day Start Januvia  100 mg every day on 08/01/23  Tresiba  70 units once every morning Humalog : 2-4 units tidac: 15 min before meals Humalog  correction scale: Use in addition to your meal time/short acting insulin  based on blood sugars as follows:  151 - 190: 1 unit 191 - 230: 2 units 231 - 270: 3 units 271 - 310: 4 units 311 - 350: 5 units 351 - 390: 6 units 391 - 430: 7 units   Low TSH <0.1 found done by PCP several times, last done in 07/2023  Patient is not currently on any thyroid  medication Start methimazole  5 mg once a day on 08/01/2023 Repeat labs in 1 month -instructed staff to fax lab orders to [facility] as patient prefers that instead of doing it with us   Previously: On Humalog  9 units before break fast, 8 units before lunch, and  12 before supper -15 min before meals  2025 Has a severe low episode with loss of consciousness. Did not remember Baqsimi . Had turned all alarms off in [place].  Now fixes lows of 60s with orange juice leading to 400s Also c/o price for all medications but wants to continue all   Turned the alarms back on for patient, patient likes high BG/afraid of lows.   Re-discussed use of Baqsimi  Turned back all alarms on libre after patients agreement and adjustment on parameters   Not interested in insulin  pump No known contraindications to any of above medications Ordered Baqsimi  on 12/14/22 and explained its use    Hyperlipidemia -Last LDL near goal: 87, ordered fasting blood work order for [facility]  -on pravastatin 80 mg QD -Follow low fat diet and exercise   -Blood pressure goal <140/90 - Microalbumin/creatinine not done, ordered it -On lisinopril 10 mg qd -diet changes including salt restriction -limit eating outside -counseled BP targets per standards of diabetes care -Uncontrolled blood pressure can lead to retinopathy, nephropathy and cardiovascular and atherosclerotic heart disease  Reviewed and counseled on: -A1C target -Blood sugar targets -Complications of uncontrolled diabetes  -Checking blood sugar before meals and bedtime and bring log next visit -All medications with mechanism of action and side effects -Hypoglycemia  management: rule of 15's, Glucagon  Emergency Kit and medical alert ID -low-carb low-fat plate-method diet -At least 20 minutes of physical activity per day -Annual dilated retinal eye exam and foot exam -compliance and follow up needs -follow up as scheduled or earlier if problem gets worse  Call if blood sugar is less than 70 or consistently above 250    Take a 15 gm snack of carbohydrate at bedtime before you go to sleep if your blood sugar is less than 100.    If you are going to fast after midnight for a test or procedure, ask your physician for  instructions on how to reduce/decrease your insulin  dose.    Call if blood sugar is less than 70 or consistently above 250  -Treating a low sugar by rule of 15  (15 gms of sugar every 15 min until sugar is more than 70) If you feel your sugar is low, test your sugar to be sure If your sugar is low (less than 70), then take 15 grams of a fast acting Carbohydrate (3-4 glucose tablets or glucose gel or 4 ounces of juice or regular soda) Recheck your sugar 15 min after treating low to make sure it is more than 70 If sugar is still less than 70, treat again with 15 grams of carbohydrate          Don't drive the hour of hypoglycemia  If unconscious/unable to eat or drink by mouth, use glucagon  injection or nasal spray baqsimi  and call 911. Can repeat again in 15 min if still unconscious.  Return in about 4 weeks (around 08/29/2023).   I have reviewed current medications, nurse's notes, allergies, vital signs, past medical and surgical history, family medical history, and social history for this encounter. Counseled patient on symptoms, examination findings, lab findings, imaging results, treatment decisions and monitoring and prognosis. The patient understood the recommendations and agrees with the treatment plan. All questions regarding treatment plan were fully answered.  Obadiah Birmingham, MD  08/01/23   History of Present Illness Bobby Downs is a 68 y.o. year old male who presents for follow up of Type 2 diabetes mellitus.  Bobby Downs was first diagnosed around 2007.   Diabetes education +  Home diabetes regimen: Tresiba  70 units once every morning Humalog  3-4 units twice a day 15 min before meals Farxiga 10 mg once a day  COMPLICATIONS + Stroke -  retinopathy, last eye exam 2023 + neuropathy -  nephropathy  Labs reviewed 06/06/23  GFR 80 AST 27 ALT 26 LDL 87 Tg 338 H HDL 40 Chol 182 VLDL 55 H  06/22/21 GFR 97 Cr 0.9 AST 12 ALT 18 Tg 85 HDL 51 VLDL 16 LDL  71  BLOOD SUGAR DATA CGM interpretation: At today's visit, we reviewed her CGM downloads. The full report is scanned in the media. Reviewing the CGM trends, BG are elevated across the day with some random lows morning-evening.  Physical Exam  BP 104/80   Pulse 75   Ht 5' 10 (1.778 m)   Wt 169 lb (76.7 kg)   SpO2 96%   BMI 24.25 kg/m    Constitutional: well developed, well nourished Head: normocephalic, atraumatic Eyes: sclera anicteric, no redness Neck: supple Lungs: normal respiratory effort Neurology: alert and oriented Skin: dry, no appreciable rashes Musculoskeletal: no appreciable defects Psychiatric: normal mood and affect Diabetic Foot Exam - Simple   No data filed       Current Medications Patient's Medications  New  Prescriptions   METHIMAZOLE  (TAPAZOLE ) 5 MG TABLET    Take 1 tablet (5 mg total) by mouth daily.   SITAGLIPTIN  (JANUVIA ) 100 MG TABLET    Take 1 tablet (100 mg total) by mouth daily.  Previous Medications   ASPIRIN EC 81 MG TABLET    Take 81 mg by mouth daily. Swallow whole.   CLOPIDOGREL (PLAVIX) 75 MG TABLET    Take 75 mg by mouth daily.   CONTINUOUS GLUCOSE SENSOR (FREESTYLE LIBRE 3 PLUS SENSOR) MISC    Change sensor every 15 days.   FARXIGA 10 MG TABS TABLET    Take 10 mg by mouth daily.   FERROUS FUMARATE (IRON) 18 MG TBCR    Take 18 mg by mouth daily.   GLUCAGON  (BAQSIMI  ONE PACK) 3 MG/DOSE POWD    Place 1 Device into the nose as needed (Low blood sugar with impaired consciousness).   INSULIN  LISPRO (HUMALOG  KWIKPEN) 100 UNIT/ML KWIKPEN    Inject 10 Units into the skin 3 (three) times daily.   LATANOPROST (XALATAN) 0.005 % OPHTHALMIC SOLUTION    1 drop at bedtime.   LISINOPRIL (ZESTRIL) 10 MG TABLET    Take 1 tablet by mouth daily.   MULTIPLE VITAMIN (MULTIVITAMIN) CAPSULE    Take 1 capsule by mouth daily.   PRAVASTATIN (PRAVACHOL) 80 MG TABLET    Take 80 mg by mouth daily.   TIMOLOL (TIMOPTIC) 0.5 % OPHTHALMIC SOLUTION    1 drop daily.   Modified Medications   No medications on file  Discontinued Medications   No medications on file    Allergies No Known Allergies  Past Medical History Past Medical History:  Diagnosis Date   Diabetes mellitus without complication (HCC)    Diabetic retinopathy (HCC)    Glaucoma    History of CVA (cerebrovascular accident)    History of melanoma    History of tobacco use    Hypertension    Insulin  dependent type 2 diabetes mellitus (HCC)     Past Surgical History Past Surgical History:  Procedure Laterality Date   DEEP NECK LYMPH NODE BIOPSY / EXCISION      Family History family history includes Brain cancer (age of onset: 80) in his brother; Breast cancer in his mother; Breast cancer (age of onset: 32) in his sister; Cancer in his maternal grandfather; Colon cancer (age of onset: 28) in his mother; Diabetes type II in his mother; Hearing loss in his sister; Hyperlipidemia in his mother; Hypertension in his mother; Seizures in his brother.  Social History Social History   Socioeconomic History   Marital status: Married    Spouse name: Not on file   Number of children: Not on file   Years of education: Not on file   Highest education level: Not on file  Occupational History   Not on file  Tobacco Use   Smoking status: Every Day    Current packs/day: 1.00    Average packs/day: 1 pack/day for 40.0 years (40.0 ttl pk-yrs)    Types: Cigarettes   Smokeless tobacco: Never  Vaping Use   Vaping status: Never Used  Substance and Sexual Activity   Alcohol use: Not Currently   Drug use: Never   Sexual activity: Not on file  Other Topics Concern   Not on file  Social History Narrative   Not on file   Social Drivers of Health   Financial Resource Strain: Not on file  Food Insecurity: No Food Insecurity (04/27/2022)   Hunger Vital  Sign    Worried About Programme researcher, broadcasting/film/video in the Last Year: Never true    Ran Out of Food in the Last Year: Never true  Transportation  Needs: No Transportation Needs (04/27/2022)   PRAPARE - Administrator, Civil Service (Medical): No    Lack of Transportation (Non-Medical): No  Physical Activity: Not on file  Stress: Not on file  Social Connections: Not on file  Intimate Partner Violence: Not At Risk (04/27/2022)   Humiliation, Afraid, Rape, and Kick questionnaire    Fear of Current or Ex-Partner: No    Emotionally Abused: No    Physically Abused: No    Sexually Abused: No    Lab Results  Component Value Date   HGBA1C 8.2 (A) 05/11/2023   No results found for: CHOL No results found for: HDL No results found for: LDLCALC No results found for: TRIG No results found for: [facility] Lab Results  Component Value Date   CREATININE 0.94 04/27/2022   No results found for: GFR No results found for: [facility] CURRENT    Component Value Date/Time   NA 140 04/27/2022 0938   K 4.2 04/27/2022 0938   CL 107 04/27/2022 0938   CO2 24 04/27/2022 0938   GLUCOSE 173 (H) 04/27/2022 0938   BUN 19 04/27/2022 0938   CREATININE 0.94 04/27/2022 0938   CALCIUM 9.2 04/27/2022 0938   PROT 7.3 04/27/2022 0938   ALBUMIN 4.1 04/27/2022 0938   AST 24 04/27/2022 0938   ALT 25 04/27/2022 0938   ALKPHOS 95 04/27/2022 0938   BILITOT 0.6 04/27/2022 0938   GFRNONAA >60 04/27/2022 0938      Latest Ref Rng & Units 04/27/2022    9:38 AM  BMP  Glucose 70 - 99 mg/dL 826   BUN 8 - 23 mg/dL 19   Creatinine 9.38 - 1.24 mg/dL 9.05   Sodium 864 - 854 mmol/L 140   Potassium 3.5 - 5.1 mmol/L 4.2   Chloride 98 - 111 mmol/L 107   CO2 22 - 32 mmol/L 24   Calcium 8.9 - 10.3 mg/dL 9.2        Component Value Date/Time   WBC 7.6 04/27/2022 0938   RBC 4.21 (L) 05/25/2022 1007   RBC 4.32 04/27/2022 0938   HGB 9.2 (L) 04/27/2022 0938   HCT 32.7 (L) 04/27/2022 0938   PLT 320 04/27/2022 0938   MCV 75.7 (L) 04/27/2022 0938   MCH 21.3 (L) 04/27/2022 0938   MCHC 28.1 (L) 04/27/2022 0938   RDW 16.7 (H) 04/27/2022 0938    LYMPHSABS 1.8 04/27/2022 0938   MONOABS 0.6 04/27/2022 0938   EOSABS 0.3 04/27/2022 0938   BASOSABS 0.1 04/27/2022 0938     Parts of this note may have been dictated using voice recognition software. There may be variances in spelling and vocabulary which are unintentional. Not all errors are proofread. Please notify the [facility] if any discrepancies are noted or if the meaning of any statement is not clear.

## 2023-08-02 ENCOUNTER — Encounter: Payer: Self-pay | Admitting: "Endocrinology

## 2023-08-03 DIAGNOSIS — E782 Mixed hyperlipidemia: Secondary | ICD-10-CM | POA: Diagnosis not present

## 2023-08-23 ENCOUNTER — Other Ambulatory Visit: Payer: Self-pay | Admitting: "Endocrinology

## 2023-08-23 DIAGNOSIS — E1165 Type 2 diabetes mellitus with hyperglycemia: Secondary | ICD-10-CM

## 2023-08-23 NOTE — Telephone Encounter (Signed)
 Requested Prescriptions   Pending Prescriptions Disp Refills   TRESIBA  FLEXTOUCH 200 UNIT/ML FlexTouch Pen [Pharmacy Med Name: TRESIBA  FLEXTOUCH 200 UNIT/ML] 27 mL 1    Sig: INJECT 64 UNITS UNDER THE SKIN DAILY AT 6AM.

## 2023-09-11 ENCOUNTER — Ambulatory Visit: Admitting: "Endocrinology

## 2023-09-11 ENCOUNTER — Encounter: Payer: Self-pay | Admitting: "Endocrinology

## 2023-09-11 VITALS — BP 124/80 | HR 76 | Ht 70.0 in | Wt 170.0 lb

## 2023-09-11 DIAGNOSIS — Z794 Long term (current) use of insulin: Secondary | ICD-10-CM | POA: Diagnosis not present

## 2023-09-11 DIAGNOSIS — E0590 Thyrotoxicosis, unspecified without thyrotoxic crisis or storm: Secondary | ICD-10-CM | POA: Diagnosis not present

## 2023-09-11 DIAGNOSIS — E1165 Type 2 diabetes mellitus with hyperglycemia: Secondary | ICD-10-CM | POA: Diagnosis not present

## 2023-09-11 DIAGNOSIS — E782 Mixed hyperlipidemia: Secondary | ICD-10-CM | POA: Diagnosis not present

## 2023-09-11 DIAGNOSIS — Z7984 Long term (current) use of oral hypoglycemic drugs: Secondary | ICD-10-CM

## 2023-09-11 LAB — POCT GLYCOSYLATED HEMOGLOBIN (HGB A1C): Hemoglobin A1C: 8.6 % — AB (ref 4.0–5.6)

## 2023-09-11 NOTE — Progress Notes (Signed)
 Outpatient Endocrinology Note Bobby Birmingham, MD  09/11/23   Bobby Downs April 09, 1955 [id number]  Referring Provider: Keren Vicenta BRAVO, MD Primary Care Provider: Keren Vicenta BRAVO, MD Reason for consultation: Subjective   Assessment & Plan  Diagnoses and all orders for this visit:  Uncontrolled type 2 diabetes mellitus with hyperglycemia (HCC) -     POCT glycosylated hemoglobin (Hb A1C) -     Ambulatory referral to diabetic education  Long-term insulin  use (HCC)  Long term (current) use of oral hypoglycemic drugs  Mixed hypercholesterolemia and hypertriglyceridemia  Hyperthyroidism -     TSH -     TRAb (TSH Receptor Binding Antibody) -     Thyroid  stimulating immunoglobulin -     T4, free -     T3, free   Diabetes complicated by neuropathy  Hba1c goal less than 7.0, current Hba1c is Lab Results  Component Value Date   HGBA1C 8.6 (A) 09/11/2023   HGBA1C 8.2 (A) 05/11/2023   HGBA1C 8.0 (A) 11/14/2022    Will recommend for the following change of medications to: Farxiga 10 mg once a day Januvia  100 mg every day on 08/01/23  Tresiba  70 units once every morning Humalog : 2-3 units tidac: 15 min before meals Humalog  correction scale: Use in addition to your meal time/short acting insulin  based on blood sugars as follows:  151 - 190: 1 unit 191 - 230: 2 units 231 - 270: 3 units 271 - 310: 4 units 311 - 350: 5 units 351 - 390: 6 units 391 - 430: 7 units   Ordered DM educator to discuss beta bionic pump  Low TSH <0.1 found done by PCP several times, last done in 07/2023  Patient was not currently on any thyroid  medication Started methimazole  5 mg once a day on 08/01/2023 Repeat labs now  Previously: On Humalog  9 units before break fast, 8 units before lunch, and 12 before supper -15 min before meals  2025 Has a severe low episode with loss of consciousness. Did not remember Baqsimi . Had turned all alarms off in [place].  Now fixes lows of 60s with  orange juice leading to 400s Also c/o price for all medications but wants to continue all   Turned the alarms back on for patient, patient likes high BG/afraid of lows.   Re-discussed use of Baqsimi  Turned back all alarms on libre after patients agreement and adjustment on parameters   Not interested in insulin  pump No known contraindications to any of above medications Ordered Baqsimi  on 12/14/22 and explained its use    Hyperlipidemia -Last LDL near goal: 87, ordered fasting blood work order for [facility]  -on pravastatin 80 mg QD -Follow low fat diet and exercise   -Blood pressure goal <140/90 - Microalbumin/creatinine not done, ordered it -On lisinopril 10 mg qd -diet changes including salt restriction -limit eating outside -counseled BP targets per standards of diabetes care -Uncontrolled blood pressure can lead to retinopathy, nephropathy and cardiovascular and atherosclerotic heart disease  Reviewed and counseled on: -A1C target -Blood sugar targets -Complications of uncontrolled diabetes  -Checking blood sugar before meals and bedtime and bring log next visit -All medications with mechanism of action and side effects -Hypoglycemia management: rule of 15's, Glucagon  Emergency Kit and medical alert ID -low-carb low-fat plate-method diet -At least 20 minutes of physical activity per day -Annual dilated retinal eye exam and foot exam -compliance and follow up needs -follow up as scheduled or earlier if problem gets worse  Call  if blood sugar is less than 70 or consistently above 250    Take a 15 gm snack of carbohydrate at bedtime before you go to sleep if your blood sugar is less than 100.    If you are going to fast after midnight for a test or procedure, ask your physician for instructions on how to reduce/decrease your insulin  dose.    Call if blood sugar is less than 70 or consistently above 250  -Treating a low sugar by rule of 15  (15 gms of sugar every 15 min  until sugar is more than 70) If you feel your sugar is low, test your sugar to be sure If your sugar is low (less than 70), then take 15 grams of a fast acting Carbohydrate (3-4 glucose tablets or glucose gel or 4 ounces of juice or regular soda) Recheck your sugar 15 min after treating low to make sure it is more than 70 If sugar is still less than 70, treat again with 15 grams of carbohydrate          Don't drive the hour of hypoglycemia  If unconscious/unable to eat or drink by mouth, use glucagon  injection or nasal spray baqsimi  and call 911. Can repeat again in 15 min if still unconscious.  Return in about 3 months (around 12/11/2023) for visit, labs today.   I have reviewed current medications, nurse's notes, allergies, vital signs, past medical and surgical history, family medical history, and social history for this encounter. Counseled patient on symptoms, examination findings, lab findings, imaging results, treatment decisions and monitoring and prognosis. The patient understood the recommendations and agrees with the treatment plan. All questions regarding treatment plan were fully answered.  Bobby Birmingham, MD  09/11/23   History of Present Illness Bobby Downs is a 68 y.o. year old male who presents for follow up of Type 2 diabetes mellitus and hyperthyroidism.  Bobby Downs was first diagnosed around 2007.   Diabetes education +  Home diabetes regimen: Tresiba  70 units once every morning Humalog  3-4 units twice a day 15 min before meals Farxiga 10 mg once a day  COMPLICATIONS + Stroke -  retinopathy, last eye exam 2023 + neuropathy -  nephropathy  Labs reviewed 06/06/23  GFR 80 AST 27 ALT 26 LDL 87 Tg 338 H HDL 40 Chol 182 VLDL 55 H  06/22/21 GFR 97 Cr 0.9 AST 12 ALT 18 Tg 85 HDL 51 VLDL 16 LDL 71  BLOOD SUGAR DATA CGM interpretation: At today's visit, we reviewed her CGM downloads. The full report is scanned in the media. Reviewing the CGM  trends, BG are elevated overnight as well as in the afternoon with some lows in the daytime.  On methimazole  5 mg every day Denies any S/E No obstructive symptoms Feels well    Physical Exam  BP 124/80   Pulse 76   Ht 5' 10 (1.778 m)   Wt 170 lb (77.1 kg)   SpO2 96%   BMI 24.39 kg/m    Constitutional: well developed, well nourished Head: normocephalic, atraumatic Eyes: sclera anicteric, no redness Neck: supple Lungs: normal respiratory effort Neurology: alert and oriented Skin: dry, no appreciable rashes Musculoskeletal: no appreciable defects Psychiatric: normal mood and affect Diabetic Foot Exam - Simple   No data filed       Current Medications Patient's Medications  New Prescriptions   No medications on file  Previous Medications   ASPIRIN EC 81 MG TABLET    Take  81 mg by mouth daily. Swallow whole.   CLOPIDOGREL (PLAVIX) 75 MG TABLET    Take 75 mg by mouth daily.   CONTINUOUS GLUCOSE SENSOR (FREESTYLE LIBRE 3 PLUS SENSOR) MISC    Change sensor every 15 days.   FARXIGA 10 MG TABS TABLET    Take 10 mg by mouth daily.   FERROUS FUMARATE (IRON) 18 MG TBCR    Take 18 mg by mouth daily.   GLUCAGON  (BAQSIMI  ONE PACK) 3 MG/DOSE POWD    Place 1 Device into the nose as needed (Low blood sugar with impaired consciousness).   INSULIN  LISPRO (HUMALOG  KWIKPEN) 100 UNIT/ML KWIKPEN    Inject 10 Units into the skin 3 (three) times daily.   LATANOPROST (XALATAN) 0.005 % OPHTHALMIC SOLUTION    1 drop at bedtime.   LISINOPRIL (ZESTRIL) 10 MG TABLET    Take 1 tablet by mouth daily.   METHIMAZOLE  (TAPAZOLE ) 5 MG TABLET    Take 1 tablet (5 mg total) by mouth daily.   MULTIPLE VITAMIN (MULTIVITAMIN) CAPSULE    Take 1 capsule by mouth daily.   PRAVASTATIN (PRAVACHOL) 80 MG TABLET    Take 80 mg by mouth daily.   SITAGLIPTIN  (JANUVIA ) 100 MG TABLET    Take 1 tablet (100 mg total) by mouth daily.   TIMOLOL (TIMOPTIC) 0.5 % OPHTHALMIC SOLUTION    1 drop daily.   TRESIBA  FLEXTOUCH 200  UNIT/ML FLEXTOUCH PEN    INJECT 64 UNITS UNDER THE SKIN DAILY AT 6AM.  Modified Medications   No medications on file  Discontinued Medications   No medications on file    Allergies No Known Allergies  Past Medical History Past Medical History:  Diagnosis Date   Diabetes mellitus without complication (HCC)    Diabetic retinopathy (HCC)    Glaucoma    History of CVA (cerebrovascular accident)    History of melanoma    History of tobacco use    Hypertension    Insulin  dependent type 2 diabetes mellitus (HCC)     Past Surgical History Past Surgical History:  Procedure Laterality Date   DEEP NECK LYMPH NODE BIOPSY / EXCISION      Family History family history includes Brain cancer (age of onset: 71) in his brother; Breast cancer in his mother; Breast cancer (age of onset: 5) in his sister; Cancer in his maternal grandfather; Colon cancer (age of onset: 79) in his mother; Diabetes type II in his mother; Hearing loss in his sister; Hyperlipidemia in his mother; Hypertension in his mother; Seizures in his brother.  Social History Social History   Socioeconomic History   Marital status: Married    Spouse name: Not on file   Number of children: Not on file   Years of education: Not on file   Highest education level: Not on file  Occupational History   Not on file  Tobacco Use   Smoking status: Every Day    Current packs/day: 1.00    Average packs/day: 1 pack/day for 40.0 years (40.0 ttl pk-yrs)    Types: Cigarettes   Smokeless tobacco: Never  Vaping Use   Vaping status: Never Used  Substance and Sexual Activity   Alcohol use: Not Currently   Drug use: Never   Sexual activity: Not on file  Other Topics Concern   Not on file  Social History Narrative   Not on file   Social Drivers of Health   Financial Resource Strain: Not on file  Food Insecurity: No Food Insecurity (  04/27/2022)   Hunger Vital Sign    Worried About Running Out of Food in the Last Year: Never true     Ran Out of Food in the Last Year: Never true  Transportation Needs: No Transportation Needs (04/27/2022)   PRAPARE - Administrator, Civil Service (Medical): No    Lack of Transportation (Non-Medical): No  Physical Activity: Not on file  Stress: Not on file  Social Connections: Not on file  Intimate Partner Violence: Not At Risk (04/27/2022)   Humiliation, Afraid, Rape, and Kick questionnaire    Fear of Current or Ex-Partner: No    Emotionally Abused: No    Physically Abused: No    Sexually Abused: No    Lab Results  Component Value Date   HGBA1C 8.6 (A) 09/11/2023   No results found for: CHOL No results found for: HDL No results found for: LDLCALC No results found for: TRIG No results found for: [facility] Lab Results  Component Value Date   CREATININE 0.94 04/27/2022   No results found for: GFR No results found for: MACKEY CURRENT    Component Value Date/Time   NA 140 04/27/2022 0938   K 4.2 04/27/2022 0938   CL 107 04/27/2022 0938   CO2 24 04/27/2022 0938   GLUCOSE 173 (H) 04/27/2022 0938   BUN 19 04/27/2022 0938   CREATININE 0.94 04/27/2022 0938   CALCIUM 9.2 04/27/2022 0938   PROT 7.3 04/27/2022 0938   ALBUMIN 4.1 04/27/2022 0938   AST 24 04/27/2022 0938   ALT 25 04/27/2022 0938   ALKPHOS 95 04/27/2022 0938   BILITOT 0.6 04/27/2022 0938   GFRNONAA >60 04/27/2022 0938      Latest Ref Rng & Units 04/27/2022    9:38 AM  BMP  Glucose 70 - 99 mg/dL 826   BUN 8 - 23 mg/dL 19   Creatinine 9.38 - 1.24 mg/dL 9.05   Sodium 864 - 854 mmol/L 140   Potassium 3.5 - 5.1 mmol/L 4.2   Chloride 98 - 111 mmol/L 107   CO2 22 - 32 mmol/L 24   Calcium 8.9 - 10.3 mg/dL 9.2        Component Value Date/Time   WBC 7.6 04/27/2022 0938   RBC 4.21 (L) 05/25/2022 1007   RBC 4.32 04/27/2022 0938   HGB 9.2 (L) 04/27/2022 0938   HCT 32.7 (L) 04/27/2022 0938   PLT 320 04/27/2022 0938   MCV 75.7 (L) 04/27/2022 0938   MCH 21.3 (L) 04/27/2022  0938   MCHC 28.1 (L) 04/27/2022 0938   RDW 16.7 (H) 04/27/2022 0938   LYMPHSABS 1.8 04/27/2022 0938   MONOABS 0.6 04/27/2022 0938   EOSABS 0.3 04/27/2022 0938   BASOSABS 0.1 04/27/2022 0938     Parts of this note may have been dictated using voice recognition software. There may be variances in spelling and vocabulary which are unintentional. Not all errors are proofread. Please notify the dino if any discrepancies are noted or if the meaning of any statement is not clear.

## 2023-09-11 NOTE — Patient Instructions (Addendum)
 Will recommend for the following change of medications to: Farxiga 10 mg once a day Januvia  100 mg every day Tresiba  70 units once every morning Humalog : 2-3 units tidac: 15 min before meals Humalog  correction scale: Use in addition to your meal time/short acting insulin  based on blood sugars as follows:  151 - 190: 1 unit 191 - 230: 2 units 231 - 270: 3 units 271 - 310: 4 units 311 - 350: 5 units 351 - 390: 6 units 391 - 430: 7 units

## 2023-09-13 LAB — TSH: TSH: 1.29 m[IU]/L (ref 0.40–4.50)

## 2023-09-13 LAB — T3, FREE: T3, Free: 3 pg/mL (ref 2.3–4.2)

## 2023-09-13 LAB — TRAB (TSH RECEPTOR BINDING ANTIBODY): TRAB: 2.4 IU/L — ABNORMAL HIGH (ref <=2.00)

## 2023-09-13 LAB — T4, FREE: Free T4: 0.9 ng/dL (ref 0.8–1.8)

## 2023-09-13 LAB — THYROID STIMULATING IMMUNOGLOBULIN: TSI: 261 %{baseline} — ABNORMAL HIGH (ref <140)

## 2023-09-14 ENCOUNTER — Telehealth: Payer: Self-pay | Admitting: Dietician

## 2023-09-14 NOTE — Telephone Encounter (Signed)
 Called patient as MD wished for him to try the iLet pancrease.  Contacted the iLet rep who states that this pump is not covered for Type 2 with Medicare.   Discussed options with patient. Discussed the Omnipod 5 pump and how it works.  He frequently misses bolusing for meals when away from home and a pump would help him with this. He wishes to research this pump before proceeding further.  He is very confident with technology. Will message MD to keep her up to date and be sure she is in agreement with a different pump plan.  Leita Constable, RD, LDN, CDCES, DipACLM

## 2023-09-19 NOTE — Telephone Encounter (Signed)
 Received message to call patient as he has a question regarding an insulin  pump. Called and spoke to patient. [employer] - iLet pump is not covered by his insurance. He is interested in the Omnipod 5 pump.  He uses a Libe 3+ CGM. 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    Insurance will cover the Omnipod pump. Patient states that he has been having more hypoglycemic events and would like something that decreases this and simplifies diabetes care.  Will message MD to see if it is ok to proceed with the Omnipod.5.  Leita Constable, RD, LDN, CDCES, DipACLM

## 2023-09-22 MED ORDER — OMNIPOD 5 LIBRE2 PLUS G6 PODS MISC: 11 refills | Status: DC

## 2023-09-22 MED ORDER — FREESTYLE LIBRE 2 PLUS SENSOR MISC: 11 refills | Status: DC

## 2023-09-22 MED ORDER — OMNIPOD 5 LIBRE2 G6 INTRO GEN5 KIT: PACK | 0 refills | Status: DC

## 2023-09-22 MED ORDER — FREESTYLE LIBRE 2 PLUS SENSOR MISC: 11 refills | Status: AC

## 2023-09-22 NOTE — Telephone Encounter (Signed)
 Called patient and informed him that the pump and pods (Omnipod 5 with Libre2 Plus) were ordered.  Discussed that this may need a prior authorization.  Discussed for him to call when he gets this so we can set up a training time. Pump intro kit, PODS, and Libre 2Plus (as libre 3plus is not compatible) were ordered per MD request. Requested Humalog  in a vial from MD for pump.  Leita Constable, RD, LDN, CDCES, DipACLM

## 2023-09-25 NOTE — Addendum Note (Signed)
 Addended by: Elain Wixon on: 09/25/2023 09:35 AM   Modules accepted: Orders

## 2023-10-03 ENCOUNTER — Telehealth: Payer: Self-pay | Admitting: Dietician

## 2023-10-03 ENCOUNTER — Telehealth: Payer: Self-pay | Admitting: "Endocrinology

## 2023-10-03 MED ORDER — OMNIPOD 5 DEXG7G6 INTRO GEN 5 KIT: PACK | 0 refills | Status: DC

## 2023-10-03 NOTE — Telephone Encounter (Signed)
 Returned patient call.  Dr. Motwani has agreed to the [facility] pump.  Orders put in last week for pods and pump but only note that the pods are in the system.  Will resend to MD. Beatris with the Omnipod Rep who will speak to him about insurance coverage. He is to call when he has the supplies for training.  He states that he has a 3 month supply of tresiba .  Discussed that we can wait for training until end of November or early December if he wishes.    Leita Constable, RD, LDN, CDCES, DipACLM

## 2023-10-03 NOTE — Telephone Encounter (Signed)
 Patient is calling saying that he is wanting clarification on what is going on.  Patient states that he is getting telephone calls from people wanting to set him up with a diabetes educator for his pump.  Patient states that he does not have a pump and the last time he spoke with Dr. Dartha it was his understanding that she did not want him to have a pump.Bobby Downs

## 2023-10-04 ENCOUNTER — Other Ambulatory Visit: Payer: Self-pay | Admitting: "Endocrinology

## 2023-10-04 MED ORDER — INSULIN LISPRO 100 UNIT/ML IJ SOLN: INTRAMUSCULAR | 3 refills | Status: AC

## 2023-10-04 NOTE — Telephone Encounter (Signed)
 Rx sent by Dr Dartha.

## 2023-10-05 ENCOUNTER — Telehealth: Payer: Self-pay | Admitting: Dietician

## 2023-10-09 DIAGNOSIS — H401134 Primary open-angle glaucoma, bilateral, indeterminate stage: Secondary | ICD-10-CM | POA: Diagnosis not present

## 2023-10-16 ENCOUNTER — Telehealth: Payer: Self-pay | Admitting: "Endocrinology

## 2023-10-16 MED ORDER — OMNIPOD 5 DEXG7G6 INTRO GEN 5 KIT: PACK | 0 refills | Status: AC

## 2023-10-16 MED ORDER — OMNIPOD 5 LIBRE2 PLUS G6 PODS MISC: 11 refills | Status: AC

## 2023-10-16 NOTE — Telephone Encounter (Signed)
 Requested Prescriptions   Signed Prescriptions Disp Refills   Insulin  Disposable Pump (OMNIPOD 5 LIBRE2 PLUS G6 PODS) MISC 15 each 11    Sig: Change pod every 2 days.    Authorizing Provider: DARTHA ERNST    Ordering User: CLEOTILDE REMAK S   Insulin  Disposable Pump (OMNIPOD 5 DEXG7G6 INTRO GEN 5) KIT 1 kit 0    Sig: Dispense 1 Kit for use 365 days per year    Authorizing Provider: DARTHA ERNST    Ordering User: CLEOTILDE REMAK RAMAN

## 2023-10-16 NOTE — Telephone Encounter (Signed)
 Patient called to advise that the pharmacy never got the Insulin  Disposable Pump (OMNIPOD 5 DEXG7G6 INTRO GEN 5) KIT.  Patient requesting that the Insulin  Disposable Pump (OMNIPOD 5 DEXG7G6 INTRO GEN 5) KIT ben sent again to the [facility] on [address]. Per patient he is going to have to order an expensive insulin  if he is unable to start the pump in a timely fashion  Call back # [phone]

## 2023-10-18 DIAGNOSIS — L814 Other melanin hyperpigmentation: Secondary | ICD-10-CM | POA: Diagnosis not present

## 2023-10-18 DIAGNOSIS — L821 Other seborrheic keratosis: Secondary | ICD-10-CM | POA: Diagnosis not present

## 2023-10-18 DIAGNOSIS — D2239 Melanocytic nevi of other parts of face: Secondary | ICD-10-CM | POA: Diagnosis not present

## 2023-10-18 DIAGNOSIS — D485 Neoplasm of uncertain behavior of skin: Secondary | ICD-10-CM | POA: Diagnosis not present

## 2023-10-18 DIAGNOSIS — D225 Melanocytic nevi of trunk: Secondary | ICD-10-CM | POA: Diagnosis not present

## 2023-10-18 DIAGNOSIS — L570 Actinic keratosis: Secondary | ICD-10-CM | POA: Diagnosis not present

## 2023-10-19 ENCOUNTER — Telehealth: Payer: Self-pay | Admitting: "Endocrinology

## 2023-10-19 ENCOUNTER — Telehealth: Payer: Self-pay | Admitting: Dietician

## 2023-10-19 MED ORDER — DEXCOM G7 SENSOR MISC: 1.0000 | 3 refills | Status: AC

## 2023-10-19 NOTE — Telephone Encounter (Signed)
 Patient has everything and can be scheduled for training. Patient only has 2 pens of Tresiba  left and would like to be schedule asap for training. I did send a prescription for the Dexcom G7 sensors.  Patient was advised that we have samples in the office that can be used during training if he doesn't have the sensor yet. Patient was upset because of all the back and forth around this process. I apologized for any confusion and assured him that the right sensors were sent to pharmacy. Patient aware that message would be sent to Leita this afternoon for her to contact him when she returns to office.

## 2023-10-19 NOTE — Telephone Encounter (Signed)
 Pump training appointment made for 10/26/23 on the Omnipod 5. Patient instructed to bring insulin  in a vial, user name and password for Omnipod 5, Omnipod 5 kit with PODs, extra CGM. Will send order request for pump settings to MD.  Leita Constable, RD, LDN, CDCES, DipACLM

## 2023-10-19 NOTE — Telephone Encounter (Signed)
 Patient is calling to say that he is having problems getting his Insulin  pump (Insulin  Disposable Pump (OMNIPOD 5 DEXG7G6 INTRO GEN 5) KIT)  to work with his Continuous Glucose Sensor (FREESTYLE LIBRE 2 PLUS SENSOR) MISC .   Patient states that the pharmacy told him that he needed the Dexcom G7 sensors.  Patient states that he is almost out of the Tresiba , but does not want to get a refill on that at this time.  Patient states that he uses   [id number] - [place], [place] - [address] (Ph: [phone])

## 2023-10-25 DIAGNOSIS — C44622 Squamous cell carcinoma of skin of right upper limb, including shoulder: Secondary | ICD-10-CM | POA: Diagnosis not present

## 2023-10-26 ENCOUNTER — Encounter: Attending: "Endocrinology | Admitting: Dietician

## 2023-10-26 DIAGNOSIS — E1142 Type 2 diabetes mellitus with diabetic polyneuropathy: Secondary | ICD-10-CM | POA: Insufficient documentation

## 2023-10-27 NOTE — Progress Notes (Unsigned)
 Patient was trained on the Omnipod 5 insulin  pump using the Dexcom G7 CGM on his phone.  Both his phone and his PDM were programmed.  Start time:  0815  End time:  1000 The pump and POD were paired.  They were also paired to his CGM. Taught patient carb counting basics and patient is doing well with this.  Omnipod Username:  Mmegimose Password:  [email or web address]  Patient forgot and took his Tresiba  this am at 6:30. Basal rate temperarily decreased 100% for 12 hours. Called patient at 1900 on 10/26/2023. Dexcom Clarity reviewed. ICR changed to 7  Patient couldn't extend his temporary basal.  He was in increased pain from hitting his hand that he just had skin cancer removed and was having difficulty focusing. Had him change all of his basal settings to 0.05 unts/hr due to the Tresiba  that he took this am. Called patient 10/27/2023.  Discussed due to his Tresiba  length of action we will continue to keep his basal rate off. 10/28/2023.  Called patient and he was able to increase his basal rate to ordered settings below.  Patient then put pump in automated mode.  Instructed him to leave pump in automated mode.  Reviewed guidelines regarding when to change a POD.  Dexcom Clarity reviewed.  Blood glucose low but now 112.  Called patient who was not available.  Left a message for him to return my call.     Patient of Dr. Dartha.  Instructed him to make an appointment to see her in about 1 week from training. 10/30/2023:  Dexcom reviewed.  Patient called.  He states that he noted his blood glucose was high after pod change yesterday so he changed out the POD again today and his glucose is now trending downward.  Discussed that he made the proper decision to change the POD.  Message sent to front office staff for him to make an appointment to see Dr. Dartha this week or early next.  Patient was trained on the following: OMNIPOD 5 AUTOMATED INSULIN  DELIVERY SYSTEM POD START CHECKLIST:  RESOURCES AND  SUPPORT  Omnipod 24/7 Customer Care [phone]  User Guide  Reviewed Customer's Bill of Rights & Responsibilities  Reviewed indications, contraindications, and safety precautions  Onboarding website, link data management account  SYSTEM FIRST TIME SETUP Omnipod 5 App set up  Choose a controller (compatible smartphone vs. Provided Controller)   Connectivity (auto=uploading of data and updates)  Omnipod ID  Controller specific settings (Ex:  Notification, Press photographer & optomization)  Basic settings - Personalized lock screen/time/time zone/date/date format  Initial insulin  settings from Pump Therapy Order Form POD  Fill port/adhesive/needle cap/pink slide insert/waterproof IP28/storage guidelines  Communication Process/distance  The following settings were programmed into the pump:  Max basal:  4 U/hr  Basal rate:  1.5 U/hr 12AM-7AM                                 2.0 U/hr 7 AM-10PM           1.5 U/hr 10 PM-12PM  Target Glucose:  110  Correct Above:  110  ICR:  6 g/unit 12AM-12AM  ISF (Correction Factor):  30 mg/dL/unit  Duration of Insulin  Action:  4 hours  Max Bolus:  10 units  Reverse Correction:  OFF  POD ACTIVATION/DEACTIVATION  Activate POD   Room temperature insulin    Fill syringe - min/max amounts   DO NOT prefill Pod  Site selection/rotation & prep    Pod & sensor in direct line of sight   Automated cannula insertion - check infusion site/viewing window & pink slide insert  When to change Pod and removal  Steps to deactivate  CONNECT TO SENSOR  Review use of sensor app  Pair/connect sensor to pod  Connection process  AUTOMATED MODE  Active with sensor values  OMNIPOD 5 APP OVERVIEW Home Screen  Status Bar, Menu Icon, Notification/[place], System Mode Indicator (Automated or Manual)  Tabs   Dashboard - IOB, sensor value and trend   Insulin    Pod Info - View Pod Details  Last Bolus Icon  Sensor Graph  Menu Icon Overview  Switch  Mode  Pause  History Details & Notifications  Settings - Basal Programs, Bolus  System Status  Automated Mode:  Limited (missing sensor values, sensor warm-up)  No Pod Communication  Advanced Features  Additional education prior to use (I.e. Activity features, extended bolus, temp basal, additional basal programs)   Customize Screen & Alerts  POD expiration alert  Low Pod Insulin  alert  ADVISORY & HAZARD ALARMS  Advisory alarms - response required  Hazard alarm - urgent attention required  SAFETY  Hypoglycemia  Hyperglycemia, ketones & DKA  Sick day management  KEY INSULIN  DELIVERY ACTIONS Switch Mode  Bolus deliver  Change Pod  Troubleshoot BG/sensor values out of range   Leita Constable, RD, LDN, CDCES, DipACLM

## 2023-10-31 ENCOUNTER — Telehealth: Payer: Self-pay | Admitting: Dietician

## 2023-10-31 NOTE — Telephone Encounter (Signed)
 Patient called stating that he is unable to get his Dexcom G7 Sensor prescription until Monday and his Sensor will expire on Sunday. Left a sensor for his wife to pick up - Lot [id number], Expiration 02/02/2025. He states that he pump is doing well and he has no questions.  Leita Constable, RD, LDN, CDCES, DipACLM

## 2023-11-02 ENCOUNTER — Other Ambulatory Visit: Payer: Self-pay | Admitting: "Endocrinology

## 2023-11-13 ENCOUNTER — Encounter: Payer: Self-pay | Admitting: "Endocrinology

## 2023-11-13 ENCOUNTER — Ambulatory Visit: Admitting: "Endocrinology

## 2023-11-13 VITALS — BP 124/80 | HR 86 | Ht 70.0 in | Wt 170.0 lb

## 2023-11-13 DIAGNOSIS — Z7984 Long term (current) use of oral hypoglycemic drugs: Secondary | ICD-10-CM

## 2023-11-13 DIAGNOSIS — Z4681 Encounter for fitting and adjustment of insulin pump: Secondary | ICD-10-CM

## 2023-11-13 DIAGNOSIS — E0500 Thyrotoxicosis with diffuse goiter without thyrotoxic crisis or storm: Secondary | ICD-10-CM

## 2023-11-13 DIAGNOSIS — Z9641 Presence of insulin pump (external) (internal): Secondary | ICD-10-CM

## 2023-11-13 DIAGNOSIS — E1165 Type 2 diabetes mellitus with hyperglycemia: Secondary | ICD-10-CM | POA: Diagnosis not present

## 2023-11-13 DIAGNOSIS — E782 Mixed hyperlipidemia: Secondary | ICD-10-CM | POA: Diagnosis not present

## 2023-11-13 NOTE — Patient Instructions (Signed)

## 2023-11-13 NOTE — Progress Notes (Signed)
 Outpatient Endocrinology Note Bobby Birmingham, MD  11/13/23   Bobby Downs 05/14/1955 [id number]  Referring Provider: Keren Vicenta BRAVO, MD Primary Care Provider: Keren Vicenta BRAVO, MD Reason for consultation: Subjective   Assessment & Plan  Diagnoses and all orders for this visit:  Uncontrolled type 2 diabetes mellitus with hyperglycemia (HCC) -     Microalbumin / creatinine urine ratio -     Lipid panel -     Comprehensive metabolic panel with GFR  Insulin  pump in place  Long term (current) use of oral hypoglycemic drugs  Mixed hypercholesterolemia and hypertriglyceridemia  Insulin  pump titration  Graves disease -     TSH -     T3, free -     T4, free    Diabetes complicated by neuropathy  Hba1c goal less than 7.0, current Hba1c is Lab Results  Component Value Date   HGBA1C 8.6 (A) 09/11/2023   HGBA1C 8.2 (A) 05/11/2023   HGBA1C 8.0 (A) 11/14/2022    Will recommend for the following change of medications to: Farxiga 10 mg once a day Januvia  100 mg every day on 08/01/23  Started omnipod with humalog  with DexCom G7 in 10/2023 Basal: 10pm am-7am: 1.7, 7am-10am: 2 unit IC 1:7 ISF 1:30>110 IOB 4 hrs  Previously on:  Tresiba  70 units once every morning Humalog : 2-3 units tidac: 15 min before meals Humalog  correction scale: Use in addition to your meal time/short acting insulin  based on blood sugars as follows:  151 - 190: 1 unit 191 - 230: 2 units 231 - 270: 3 units 271 - 310: 4 units 311 - 350: 5 units 351 - 390: 6 units 391 - 430: 7 units   Ordered DM educator to discuss beta bionic pump  Low TSH <0.1 found done by PCP several times, last done in 07/2023  09/11/23: TSI, TRAb +ve Patient was not currently on any thyroid  medication Started methimazole  5 mg once a day on 08/01/2023 Continue current dose   Previously: On Humalog  9 units before break fast, 8 units before lunch, and 12 before supper -15 min before meals  2025 Has a severe  low episode with loss of consciousness. Did not remember Baqsimi . Had turned all alarms off in [place].  Now fixes lows of 60s with orange juice leading to 400s Also c/o price for all medications but wants to continue all   Turned the alarms back on for patient, patient likes high BG/afraid of lows.   Re-discussed use of Baqsimi  Turned back all alarms on libre after patients agreement and adjustment on parameters   Not interested in insulin  pump No known contraindications to any of above medications Ordered Baqsimi  on 12/14/22 and explained its use    Hyperlipidemia -Last LDL near goal: 87, ordered fasting blood work order for [facility]  -on pravastatin 80 mg QD -Follow low fat diet and exercise   -Blood pressure goal <140/90 - Microalbumin/creatinine not done, ordered it -On lisinopril 10 mg qd -diet changes including salt restriction -limit eating outside -counseled BP targets per standards of diabetes care -Uncontrolled blood pressure can lead to retinopathy, nephropathy and cardiovascular and atherosclerotic heart disease  Reviewed and counseled on: -A1C target -Blood sugar targets -Complications of uncontrolled diabetes  -Checking blood sugar before meals and bedtime and bring log next visit -All medications with mechanism of action and side effects -Hypoglycemia management: rule of 15's, Glucagon  Emergency Kit and medical alert ID -low-carb low-fat plate-method diet -At least 20 minutes of physical activity  per day -Annual dilated retinal eye exam and foot exam -compliance and follow up needs -follow up as scheduled or earlier if problem gets worse  Call if blood sugar is less than 70 or consistently above 250    Take a 15 gm snack of carbohydrate at bedtime before you go to sleep if your blood sugar is less than 100.    If you are going to fast after midnight for a test or procedure, ask your physician for instructions on how to reduce/decrease your insulin  dose.    Call  if blood sugar is less than 70 or consistently above 250  -Treating a low sugar by rule of 15  (15 gms of sugar every 15 min until sugar is more than 70) If you feel your sugar is low, test your sugar to be sure If your sugar is low (less than 70), then take 15 grams of a fast acting Carbohydrate (3-4 glucose tablets or glucose gel or 4 ounces of juice or regular soda) Recheck your sugar 15 min after treating low to make sure it is more than 70 If sugar is still less than 70, treat again with 15 grams of carbohydrate          Don't drive the hour of hypoglycemia  If unconscious/unable to eat or drink by mouth, use glucagon  injection or nasal spray baqsimi  and call 911. Can repeat again in 15 min if still unconscious.  Return in about 2 months (around 01/13/2024).   I have reviewed current medications, nurse's notes, allergies, vital signs, past medical and surgical history, family medical history, and social history for this encounter. Counseled patient on symptoms, examination findings, lab findings, imaging results, treatment decisions and monitoring and prognosis. The patient understood the recommendations and agrees with the treatment plan. All questions regarding treatment plan were fully answered.  Bobby Birmingham, MD  11/13/23   History of Present Illness Bobby Downs is a 68 y.o. year old male who presents for follow up of Type 2 diabetes mellitus and hyperthyroidism.  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   Diabetes education +  Home diabetes regimen:  Farxiga 10 mg once a day Januvia  100 mg every day on 08/01/23  Started omnipod with humalog  with DexCom G7 in 10/2023 Basal: 10pm am-7am: 1.5, 7am-10am: 2 unit IC 1:7 ISF 1:30>110 IOB 4 hrs  Pre-pump: Tresiba  70 units once every morning Humalog  3-4 units twice a day 15 min before meals  COMPLICATIONS + Stroke -  retinopathy, last eye exam 2023 + neuropathy -  nephropathy  Labs reviewed 06/06/23   GFR 80 AST 27 ALT 26 LDL 87 Tg 338 H HDL 40 Chol 182 VLDL 55 H  06/22/21 GFR 97 Cr 0.9 AST 12 ALT 18 Tg 85 HDL 51 VLDL 16 LDL 71  BLOOD SUGAR DATA CGM interpretation: At today's visit, we reviewed her CGM downloads. The full report is scanned in the media. Reviewing the CGM trends, BG are elevated late nigh-overnight, some lows around dinner time.   On methimazole  5 mg every day Denies any S/E No obstructive symptoms Feels well    Physical Exam  BP 124/80   Pulse 86   Ht 5' 10 (1.778 m)   Wt 170 lb (77.1 kg)   SpO2 96%   BMI 24.39 kg/m    Constitutional: well developed, well nourished Head: normocephalic, atraumatic Eyes: sclera anicteric, no redness Neck: supple Lungs: normal respiratory effort Neurology: alert and oriented Skin: dry, no appreciable rashes  Musculoskeletal: no appreciable defects Psychiatric: normal mood and affect Diabetic Foot Exam - Simple   No data filed       Current Medications Patient's Medications  New Prescriptions   No medications on file  Previous Medications   ASPIRIN EC 81 MG TABLET    Take 81 mg by mouth daily. Swallow whole.   CLOPIDOGREL (PLAVIX) 75 MG TABLET    Take 75 mg by mouth daily.   CONTINUOUS GLUCOSE SENSOR (DEXCOM G7 SENSOR) MISC    1 Device by Does not apply route continuous.   CONTINUOUS GLUCOSE SENSOR (FREESTYLE LIBRE 2 PLUS SENSOR) MISC    Change sensor every 15 days.   FARXIGA 10 MG TABS TABLET    Take 10 mg by mouth daily.   FERROUS FUMARATE (IRON) 18 MG TBCR    Take 18 mg by mouth daily.   GLUCAGON  (BAQSIMI  ONE PACK) 3 MG/DOSE POWD    Place 1 Device into the nose as needed (Low blood sugar with impaired consciousness).   INSULIN  DISPOSABLE PUMP (OMNIPOD 5 DEXG7G6 INTRO GEN 5) KIT    Dispense 1 Kit for use 365 days per year   INSULIN  DISPOSABLE PUMP (OMNIPOD 5 LIBRE2 PLUS G6 PODS) MISC    Change pod every 2 days.   INSULIN  LISPRO (HUMALOG  KWIKPEN) 100 UNIT/ML KWIKPEN    Inject 10 Units into the skin 3  (three) times daily.   INSULIN  LISPRO (HUMALOG ) 100 UNIT/ML INJECTION    Up to 100 units/day to be used by insulin  pump   LATANOPROST (XALATAN) 0.005 % OPHTHALMIC SOLUTION    1 drop at bedtime.   LISINOPRIL (ZESTRIL) 10 MG TABLET    Take 1 tablet by mouth daily.   METHIMAZOLE  (TAPAZOLE ) 5 MG TABLET    TAKE 1 TABLET BY MOUTH DAILY   MULTIPLE VITAMIN (MULTIVITAMIN) CAPSULE    Take 1 capsule by mouth daily.   PRAVASTATIN (PRAVACHOL) 80 MG TABLET    Take 80 mg by mouth daily.   SITAGLIPTIN  (JANUVIA ) 100 MG TABLET    Take 1 tablet (100 mg total) by mouth daily.   TIMOLOL (TIMOPTIC) 0.5 % OPHTHALMIC SOLUTION    1 drop daily.   TRESIBA  FLEXTOUCH 200 UNIT/ML FLEXTOUCH PEN    INJECT 64 UNITS UNDER THE SKIN DAILY AT 6AM.  Modified Medications   No medications on file  Discontinued Medications   No medications on file    Allergies No Known Allergies  Past Medical History Past Medical History:  Diagnosis Date   Diabetes mellitus without complication (HCC)    Diabetic retinopathy (HCC)    Glaucoma    History of CVA (cerebrovascular accident)    History of melanoma    History of tobacco use    Hypertension    Insulin  dependent type 2 diabetes mellitus (HCC)     Past Surgical History Past Surgical History:  Procedure Laterality Date   DEEP NECK LYMPH NODE BIOPSY / EXCISION      Family History family history includes Brain cancer (age of onset: 73) in his brother; Breast cancer in his mother; Breast cancer (age of onset: 45) in his sister; Cancer in his maternal grandfather; Colon cancer (age of onset: 53) in his mother; Diabetes type II in his mother; Hearing loss in his sister; Hyperlipidemia in his mother; Hypertension in his mother; Seizures in his brother.  Social History Social History   Socioeconomic History   Marital status: Married    Spouse name: Not on file   Number of children: Not  on file   Years of education: Not on file   Highest education level: Not on file   Occupational History   Not on file  Tobacco Use   Smoking status: Every Day    Current packs/day: 1.00    Average packs/day: 1 pack/day for 40.0 years (40.0 ttl pk-yrs)    Types: Cigarettes   Smokeless tobacco: Never  Vaping Use   Vaping status: Never Used  Substance and Sexual Activity   Alcohol use: Not Currently   Drug use: Never   Sexual activity: Not on file  Other Topics Concern   Not on file  Social History Narrative   Not on file   Social Drivers of Health   Financial Resource Strain: Not on file  Food Insecurity: No Food Insecurity (04/27/2022)   Hunger Vital Sign    Worried About Running Out of Food in the Last Year: Never true    Ran Out of Food in the Last Year: Never true  Transportation Needs: No Transportation Needs (04/27/2022)   PRAPARE - Administrator, Civil Service (Medical): No    Lack of Transportation (Non-Medical): No  Physical Activity: Not on file  Stress: Not on file  Social Connections: Not on file  Intimate Partner Violence: Not At Risk (04/27/2022)   Humiliation, Afraid, Rape, and Kick questionnaire    Fear of Current or Ex-Partner: No    Emotionally Abused: No    Physically Abused: No    Sexually Abused: No    Lab Results  Component Value Date   HGBA1C 8.6 (A) 09/11/2023   No results found for: CHOL No results found for: HDL No results found for: LDLCALC No results found for: TRIG No results found for: [facility] Lab Results  Component Value Date   CREATININE 0.94 04/27/2022   No results found for: GFR No results found for: MACKEY CURRENT    Component Value Date/Time   NA 140 04/27/2022 0938   K 4.2 04/27/2022 0938   CL 107 04/27/2022 0938   CO2 24 04/27/2022 0938   GLUCOSE 173 (H) 04/27/2022 0938   BUN 19 04/27/2022 0938   CREATININE 0.94 04/27/2022 0938   CALCIUM 9.2 04/27/2022 0938   PROT 7.3 04/27/2022 0938   ALBUMIN 4.1 04/27/2022 0938   AST 24 04/27/2022 0938   ALT 25 04/27/2022  0938   ALKPHOS 95 04/27/2022 0938   BILITOT 0.6 04/27/2022 0938   GFRNONAA >60 04/27/2022 0938      Latest Ref Rng & Units 04/27/2022    9:38 AM  BMP  Glucose 70 - 99 mg/dL 826   BUN 8 - 23 mg/dL 19   Creatinine 9.38 - 1.24 mg/dL 9.05   Sodium 864 - 854 mmol/L 140   Potassium 3.5 - 5.1 mmol/L 4.2   Chloride 98 - 111 mmol/L 107   CO2 22 - 32 mmol/L 24   Calcium 8.9 - 10.3 mg/dL 9.2        Component Value Date/Time   WBC 7.6 04/27/2022 0938   RBC 4.21 (L) 05/25/2022 1007   RBC 4.32 04/27/2022 0938   HGB 9.2 (L) 04/27/2022 0938   HCT 32.7 (L) 04/27/2022 0938   PLT 320 04/27/2022 0938   MCV 75.7 (L) 04/27/2022 0938   MCH 21.3 (L) 04/27/2022 0938   MCHC 28.1 (L) 04/27/2022 0938   RDW 16.7 (H) 04/27/2022 0938   LYMPHSABS 1.8 04/27/2022 0938   MONOABS 0.6 04/27/2022 0938   EOSABS 0.3 04/27/2022 0938   BASOSABS  0.1 04/27/2022 9061     Parts of this note may have been dictated using voice recognition software. There may be variances in spelling and vocabulary which are unintentional. Not all errors are proofread. Please notify the dino if any discrepancies are noted or if the meaning of any statement is not clear.

## 2023-11-14 ENCOUNTER — Encounter: Payer: Self-pay | Admitting: "Endocrinology

## 2023-11-21 ENCOUNTER — Other Ambulatory Visit

## 2023-11-21 LAB — TSH: TSH: 4.23 m[IU]/L (ref 0.40–4.50)

## 2023-11-21 LAB — T4, FREE: Free T4: 0.9 ng/dL (ref 0.8–1.8)

## 2023-11-21 LAB — T3, FREE: T3, Free: 3 pg/mL (ref 2.3–4.2)

## 2023-11-22 LAB — COMPREHENSIVE METABOLIC PANEL WITH GFR
AG Ratio: 1.9 (calc) (ref 1.0–2.5)
ALT: 14 U/L (ref 9–46)
AST: 18 U/L (ref 10–35)
Albumin: 4.3 g/dL (ref 3.6–5.1)
Alkaline phosphatase (APISO): 99 U/L (ref 35–144)
BUN: 14 mg/dL (ref 7–25)
CO2: 29 mmol/L (ref 20–32)
Calcium: 9.8 mg/dL (ref 8.6–10.3)
Chloride: 107 mmol/L (ref 98–110)
Creat: 0.91 mg/dL (ref 0.70–1.35)
Globulin: 2.3 g/dL (ref 1.9–3.7)
Glucose, Bld: 156 mg/dL — ABNORMAL HIGH (ref 65–99)
Potassium: 4.9 mmol/L (ref 3.5–5.3)
Sodium: 144 mmol/L (ref 135–146)
Total Bilirubin: 0.4 mg/dL (ref 0.2–1.2)
Total Protein: 6.6 g/dL (ref 6.1–8.1)
eGFR: 92 mL/min/1.73m2 (ref >=60)

## 2023-11-22 LAB — MICROALBUMIN / CREATININE URINE RATIO
Creatinine, Urine: 71 mg/dL (ref 20–320)
Microalb Creat Ratio: 8 mg/g{creat} (ref <30)
Microalb, Ur: 0.6 mg/dL

## 2023-11-22 LAB — LIPID PANEL
Cholesterol: 189 mg/dL (ref <200)
HDL: 47 mg/dL (ref >=40)
LDL Cholesterol (Calc): 92 mg/dL
Non-HDL Cholesterol (Calc): 142 mg/dL — ABNORMAL HIGH (ref <130)
Total CHOL/HDL Ratio: 4 (calc) (ref <5.0)
Triglycerides: 375 mg/dL — ABNORMAL HIGH (ref <150)

## 2023-12-04 NOTE — Progress Notes (Signed)
 Bobby Downs                                          MRN: [id number]   12/04/2023   The [facility] Quality Team Specialist reviewed this patient medical record for the purposes of chart review for care gap closure. The following were reviewed: abstraction for care gap closure-glycemic status assessment.    [facility] Quality Team

## 2023-12-12 ENCOUNTER — Ambulatory Visit: Admitting: "Endocrinology

## 2024-01-22 ENCOUNTER — Ambulatory Visit: Admitting: "Endocrinology

## 2024-01-28 ENCOUNTER — Other Ambulatory Visit: Payer: Self-pay | Admitting: "Endocrinology

## 2024-01-29 ENCOUNTER — Ambulatory Visit: Admitting: "Endocrinology

## 2024-01-30 ENCOUNTER — Other Ambulatory Visit: Payer: Self-pay | Admitting: "Endocrinology

## 2024-02-06 ENCOUNTER — Ambulatory Visit: Admitting: "Endocrinology

## 2024-02-14 ENCOUNTER — Ambulatory Visit: Admitting: "Endocrinology
